# Patient Record
Sex: Male | Born: 1965
Health system: Southern US, Community
[De-identification: ages and names within clinical notes are randomized; demographics above are authoritative.]

## PROBLEM LIST (undated history)

## (undated) ENCOUNTER — Ambulatory Visit: Admission: EM | Discharge status: Absent without leave | Payer: Federal, State, Local not specified - PPO

## (undated) ENCOUNTER — Ambulatory Visit

## (undated) DIAGNOSIS — I10 Essential (primary) hypertension: Secondary | ICD-10-CM

## (undated) HISTORY — PX: HERNIA REPAIR: SHX51

## (undated) HISTORY — PX: NASAL SINUS SURGERY: SHX719

---

## 1998-07-08 ENCOUNTER — Emergency Department (HOSPITAL_COMMUNITY): Admission: EM | Admit: 1998-07-08 | Discharge: 1998-07-08 | Payer: Self-pay | Admitting: Emergency Medicine

## 2000-06-25 ENCOUNTER — Ambulatory Visit: Admission: RE | Admit: 2000-06-25 | Discharge: 2000-06-25 | Payer: Self-pay | Admitting: General Surgery

## 2002-05-12 ENCOUNTER — Emergency Department (HOSPITAL_COMMUNITY): Admission: EM | Admit: 2002-05-12 | Discharge: 2002-05-13 | Payer: Self-pay | Admitting: Emergency Medicine

## 2006-05-06 ENCOUNTER — Emergency Department (HOSPITAL_COMMUNITY): Admission: EM | Admit: 2006-05-06 | Discharge: 2006-05-06 | Payer: Self-pay | Admitting: Emergency Medicine

## 2010-05-06 ENCOUNTER — Emergency Department (HOSPITAL_COMMUNITY): Admission: EM | Admit: 2010-05-06 | Discharge: 2010-05-06 | Payer: Self-pay | Admitting: Emergency Medicine

## 2011-03-16 NOTE — Op Note (Signed)
[place]  Patient:    Sean Clements, Sean Clements                    MRN: [id number] Proc. Date: 06/24/00 Adm. Date:  06/24/00 Attending:  Sharlet Salina T. Hoxworth, M.D.                           Operative Report  PREOPERATIVE DIAGNOSIS:  Umbilical hernia.  PREOPERATIVE DIAGNOSIS:  Umbilical hernia.  OPERATION:   Repair of umbilical hernia with mesh.  SURGEON:  Lorne Skeens. Hoxworth, M.D.  ANESTHESIA:  General.  INDICATIONS:  Sean Clements is a 45 year old white male who presents with a history of a uncomfortable and enlarging bulging umbilicus and exam confirms a reduced umbilical hernia.  Repair under general anesthesia has been recommended and accepted.  The nature of the procedures, indications, use of mesh and risks of bleeding, infection and recurrence were discussed and bleeding, infection and recurrence were discussed and understood preoperatively.  He is now brought to the operating room for this procedure.  DESCRIPTION OF PROCEDURE:  The patient was brought to the operating room and placed in the supine position on the operating room table, and general endotracheal anesthesia was induced.  The abdomen sterilely prepped and draped.  Broad spectrum antibiotics were given preoperatively.  A 1 curvilinear incision was made just below the umbilicus and dissection carried down to the subcutaneous tissue.  The umbilical skin was then sharply dissected up off of the hernia defect.  The defect contained preperitoneal fat with about 1 cm in diameter.  Subcutaneous was cleared off the fascia back 2-3 cm in all directions.  The preperitoneal hernia contents were reduced and the defect was then closed with interrupted inverting 0 Prolene sutures transversely.  A piece of Prolene mesh was then trimmed to size approximately 5 x 5 cm and was placed as an onlay and sutured in place with interrupted 0 Prolene.  The wound was irrigated and inspected for  hemostasis which was complete.  The soft tissue was infiltrated with Marcaine.  The subcutaneous was closed with interrupted 5-0 Monocryl and the skin with running subcuticular 5-0 Monocryl and Steri-Strips.  Sponge, needle and instrument counts were correct. Dry sterile was applied.  The patient taken to recovery in good condition. DD:  06/25/00 TD:  06/25/00 Job: [id number]

## 2011-11-19 ENCOUNTER — Ambulatory Visit: Payer: BC Managed Care – PPO | Attending: Internal Medicine | Admitting: Physical Therapy

## 2012-10-28 ENCOUNTER — Emergency Department (HOSPITAL_COMMUNITY): Payer: BC Managed Care – PPO

## 2012-10-28 ENCOUNTER — Encounter (HOSPITAL_COMMUNITY): Payer: Self-pay | Admitting: Emergency Medicine

## 2012-10-28 ENCOUNTER — Emergency Department (HOSPITAL_COMMUNITY)
Admission: EM | Admit: 2012-10-28 | Discharge: 2012-10-28 | Disposition: A | Discharge status: Home / Self Care | Payer: BC Managed Care – PPO | Attending: Emergency Medicine | Admitting: Emergency Medicine

## 2012-10-28 DIAGNOSIS — Y92410 Unspecified street and highway as the place of occurrence of the external cause: Secondary | ICD-10-CM | POA: Insufficient documentation

## 2012-10-28 DIAGNOSIS — Y9389 Activity, other specified: Secondary | ICD-10-CM | POA: Insufficient documentation

## 2012-10-28 DIAGNOSIS — S0993XA Unspecified injury of face, initial encounter: Secondary | ICD-10-CM | POA: Insufficient documentation

## 2012-10-28 DIAGNOSIS — Z79899 Other long term (current) drug therapy: Secondary | ICD-10-CM | POA: Insufficient documentation

## 2012-10-28 MED ORDER — NAPROXEN 500 MG PO TABS: 500.0000 mg | ORAL_TABLET | Freq: Two times a day (BID) | ORAL | Status: DC

## 2012-10-28 MED ORDER — DIAZEPAM 5 MG PO TABS: 5.0000 mg | ORAL_TABLET | Freq: Two times a day (BID) | ORAL | Status: DC
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
## 2012-10-29 NOTE — ED Provider Notes (Signed)
Medical screening examination/treatment/procedure(s) were performed by non-physician practitioner and as supervising physician I was immediately available for consultation/collaboration.  Zelena Bushong T Sorayah Schrodt, MD 10/29/12 1516 
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
## 2015-02-02 ENCOUNTER — Emergency Department (HOSPITAL_COMMUNITY)
Admission: EM | Admit: 2015-02-02 | Discharge: 2015-02-02 | Disposition: A | Discharge status: Home / Self Care | Attending: Emergency Medicine | Admitting: Emergency Medicine

## 2015-02-02 ENCOUNTER — Encounter (HOSPITAL_COMMUNITY): Payer: Self-pay | Admitting: *Deleted

## 2015-02-02 DIAGNOSIS — Y990 Civilian activity done for income or pay: Secondary | ICD-10-CM | POA: Diagnosis not present

## 2015-02-02 DIAGNOSIS — S29019A Strain of muscle and tendon of unspecified wall of thorax, initial encounter: Secondary | ICD-10-CM

## 2015-02-02 DIAGNOSIS — Y9289 Other specified places as the place of occurrence of the external cause: Secondary | ICD-10-CM | POA: Insufficient documentation

## 2015-02-02 DIAGNOSIS — X58XXXA Exposure to other specified factors, initial encounter: Secondary | ICD-10-CM | POA: Insufficient documentation

## 2015-02-02 DIAGNOSIS — Y9389 Activity, other specified: Secondary | ICD-10-CM | POA: Insufficient documentation

## 2015-02-02 DIAGNOSIS — S3992XA Unspecified injury of lower back, initial encounter: Secondary | ICD-10-CM | POA: Diagnosis present

## 2015-02-02 DIAGNOSIS — S29012A Strain of muscle and tendon of back wall of thorax, initial encounter: Secondary | ICD-10-CM | POA: Diagnosis not present

## 2015-02-02 MED ORDER — LIDOCAINE 5 % EX PTCH
1.0000 | MEDICATED_PATCH | CUTANEOUS | Status: DC
  Administered 2015-02-02: 1 via TRANSDERMAL
  Filled 2015-02-02: qty 1

## 2015-02-02 MED ORDER — TRAMADOL HCL 50 MG PO TABS: 50.0000 mg | ORAL_TABLET | Freq: Four times a day (QID) | ORAL | Status: DC | PRN

## 2015-02-02 MED ORDER — TRAMADOL HCL 50 MG PO TABS
50.0000 mg | ORAL_TABLET | Freq: Once | ORAL | Status: AC
  Administered 2015-02-02: 50 mg via ORAL
  Filled 2015-02-02: qty 1

## 2015-02-02 MED ORDER — CYCLOBENZAPRINE HCL 10 MG PO TABS
5.0000 mg | ORAL_TABLET | Freq: Once | ORAL | Status: AC
  Administered 2015-02-02: 5 mg via ORAL
  Filled 2015-02-02: qty 1

## 2015-02-02 MED ORDER — CYCLOBENZAPRINE HCL 5 MG PO TABS: 5.0000 mg | ORAL_TABLET | Freq: Three times a day (TID) | ORAL | Status: DC

## 2015-02-02 NOTE — ED Notes (Signed)
Pt states that he was at work and was lifting and felt pain to his left mid back area; pt states that the pain is worse with movement; pt denies numbness or tingling

## 2015-02-02 NOTE — Discharge Instructions (Signed)
Cryotherapy Cryotherapy is when you put ice on your injury. Ice helps lessen pain and puffiness (swelling) after an injury. Ice works the best when you start using it in the first 24 to 48 hours after an injury. HOME CARE  Put a dry or damp towel between the ice pack and your skin.  You may press gently on the ice pack.  Leave the ice on for no more than 10 to 20 minutes at a time.  Check your skin after 5 minutes to make sure your skin is okay.  Rest at least 20 minutes between ice pack uses.  Stop using ice when your skin loses feeling (numbness).  Do not use ice on someone who cannot tell you when it hurts. This includes small children and people with memory problems (dementia). GET HELP RIGHT AWAY IF:  You have white spots on your skin.  Your skin turns blue or pale.  Your skin feels waxy or hard.  Your puffiness gets worse. MAKE SURE YOU:   Understand these instructions.  Will watch your condition.  Will get help right away if you are not doing well or get worse. Document Released: 04/02/2008 Document Revised: 01/07/2012 Document Reviewed: 06/07/2011 ExitCare Patient Information 2015 [employer]. This information is not intended to replace advice given to you by your health care provider. Make sure you discuss any questions you have with your health care provider.  

## 2015-02-02 NOTE — ED Provider Notes (Signed)
CSN: [id number]     Arrival date & time 02/02/15  2042 History   None    Chief Complaint  Patient presents with  . Back Pain   Patient is a 49 y.o. male presenting with back pain. The history is provided by the patient. No language interpreter was used.  Back Pain Pain location: left side. Quality:  Aching Radiates to:  Does not radiate Pain severity:  Moderate Pain is:  Same all the time Onset quality:  Sudden Duration:  5 hours Timing:  Constant Progression:  Unchanged Chronicity:  New Context: lifting heavy objects   Relieved by:  Nothing Worsened by:  Movement Ineffective treatments:  Ibuprofen Associated symptoms: no fever and no headaches   Risk factors: obesity    This chart was scribed for nurse practitioner Earley Favor, NP working with Raeford Razor, MD, by Andrew Au, ED Scribe. This patient was seen in room WTR7/WTR7 and the patient's care was started at 10:18 PM.  Sean Clements is a 49 y.o. male who presents to the Emergency Department complaining of back pain that began 4.5 hours ago. 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 Pt reports taking ibuprofen without relief.   History reviewed. No pertinent past medical history. Past Surgical History  Procedure Laterality Date  . Hernia repair    . Nasal sinus surgery     No family history on file. History  Substance Use Topics  . Smoking status: Never Smoker   . Smokeless tobacco: Not on file  . Alcohol Use: Yes     Comment: rarely    Review of Systems  Constitutional: Negative for fever.  Respiratory: Negative for shortness of breath.   Musculoskeletal: Positive for back pain and arthralgias.  Skin: Negative for rash and wound.  Neurological: Negative for dizziness and headaches.  All other systems reviewed and are negative.  Allergies  Review of patient's allergies indicates no known allergies.  Home Medications   Prior to Admission  medications   Medication Sig Start Date End Date Taking? Authorizing Provider  ibuprofen (ADVIL,MOTRIN) 200 MG tablet Take 400-600 mg by mouth 2 (two) times daily as needed for moderate pain (pain). As needed for pain   Yes Historical Provider, MD  lisinopril-hydrochlorothiazide (PRINZIDE,ZESTORETIC) 10-12.5 MG per tablet Take 1 tablet by mouth daily.   Yes Historical Provider, MD  cyclobenzaprine (FLEXERIL) 5 MG tablet Take 1 tablet (5 mg total) by mouth 3 (three) times daily. 02/02/15   Earley Favor, NP  diazepam (VALIUM) 5 MG tablet Take 1 tablet (5 mg total) by mouth 2 (two) times daily. Patient not taking: Reported on 02/02/2015 10/28/12   Santiago Glad, PA-C  naproxen (NAPROSYN) 500 MG tablet Take 1 tablet (500 mg total) by mouth 2 (two) times daily. Patient not taking: Reported on 02/02/2015 10/28/12   Santiago Glad, PA-C  traMADol (ULTRAM) 50 MG tablet Take 1 tablet (50 mg total) by mouth every 6 (six) hours as needed for severe pain. 02/02/15   Earley Favor, NP   BP 139/82 mmHg  Pulse 76  Temp(Src) 98 F (36.7 C) (Oral)  Resp 18  SpO2 100% Physical Exam  Constitutional: He is oriented to person, place, and time. He appears well-developed and well-nourished. No distress.  HENT:  Head: Normocephalic and atraumatic.  Eyes: Conjunctivae and EOM are normal.  Neck: Normal range of motion. Neck supple.  Cardiovascular: Normal rate.   Pulmonary/Chest: Effort normal.  Musculoskeletal: Normal range of motion.       Lumbar back: He exhibits tenderness. He exhibits normal range of motion, no swelling, no pain and no spasm.       Back:  Neurological: He is alert and oriented to person, place, and time.  Skin: Skin is warm and dry.  Psychiatric: He has a normal mood and affect. His behavior is normal.  Nursing note and vitals reviewed.   ED Course  Procedures (including critical care time) DIAGNOSTIC STUDIES: Oxygen Saturation is 100% on RA, normal  by my interpretation.    COORDINATION  OF CARE: 10:18 PM- Pt advised of plan for treatment which includes a muscle relaxer and pt agrees.  Labs Review Labs Reviewed - No data to display  Imaging Review No results found.   EKG Interpretation None     Will Rx Ibuprofen, Flexerile, Ultram for severe pain and apply Lidocaine patch in ED MDM   Final diagnoses:  Strain of muscle of torso, initial encounter    I personally performed the services described in this documentation, which was scribed in my presence. The recorded information has been reviewed and is accurate.   Earley FavorGail Gaelle Adriance, NP 02/02/15 [id number]  Raeford RazorStephen Kohut, MD 02/02/15 2322

## 2016-02-14 DIAGNOSIS — M5416 Radiculopathy, lumbar region: Secondary | ICD-10-CM | POA: Diagnosis not present

## 2016-02-14 DIAGNOSIS — M545 Low back pain: Secondary | ICD-10-CM | POA: Diagnosis not present

## 2016-02-20 DIAGNOSIS — M545 Low back pain: Secondary | ICD-10-CM | POA: Diagnosis not present

## 2016-02-23 DIAGNOSIS — M545 Low back pain: Secondary | ICD-10-CM | POA: Diagnosis not present

## 2016-04-16 DIAGNOSIS — M545 Low back pain: Secondary | ICD-10-CM | POA: Diagnosis not present

## 2016-04-27 DIAGNOSIS — H40013 Open angle with borderline findings, low risk, bilateral: Secondary | ICD-10-CM | POA: Diagnosis not present

## 2016-06-01 DIAGNOSIS — I10 Essential (primary) hypertension: Secondary | ICD-10-CM | POA: Diagnosis not present

## 2016-06-28 DIAGNOSIS — F331 Major depressive disorder, recurrent, moderate: Secondary | ICD-10-CM | POA: Diagnosis not present

## 2016-07-19 DIAGNOSIS — I10 Essential (primary) hypertension: Secondary | ICD-10-CM | POA: Diagnosis not present

## 2016-11-23 DIAGNOSIS — N400 Enlarged prostate without lower urinary tract symptoms: Secondary | ICD-10-CM | POA: Diagnosis not present

## 2016-11-30 DIAGNOSIS — N400 Enlarged prostate without lower urinary tract symptoms: Secondary | ICD-10-CM | POA: Diagnosis not present

## 2017-05-20 DIAGNOSIS — M545 Low back pain: Secondary | ICD-10-CM | POA: Diagnosis not present

## 2017-07-02 DIAGNOSIS — H40013 Open angle with borderline findings, low risk, bilateral: Secondary | ICD-10-CM | POA: Diagnosis not present

## 2017-07-04 DIAGNOSIS — I10 Essential (primary) hypertension: Secondary | ICD-10-CM | POA: Diagnosis not present

## 2017-07-04 DIAGNOSIS — R600 Localized edema: Secondary | ICD-10-CM | POA: Diagnosis not present

## 2018-01-13 DIAGNOSIS — H40013 Open angle with borderline findings, low risk, bilateral: Secondary | ICD-10-CM | POA: Diagnosis not present

## 2018-06-11 ENCOUNTER — Encounter (HOSPITAL_BASED_OUTPATIENT_CLINIC_OR_DEPARTMENT_OTHER): Payer: Self-pay | Admitting: Emergency Medicine

## 2018-06-11 ENCOUNTER — Other Ambulatory Visit: Payer: Self-pay

## 2018-06-11 ENCOUNTER — Emergency Department (HOSPITAL_BASED_OUTPATIENT_CLINIC_OR_DEPARTMENT_OTHER)
Admission: EM | Admit: 2018-06-11 | Discharge: 2018-06-12 | Disposition: A | Discharge status: Home / Self Care | Attending: Emergency Medicine | Admitting: Emergency Medicine

## 2018-06-11 DIAGNOSIS — Y990 Civilian activity done for income or pay: Secondary | ICD-10-CM | POA: Insufficient documentation

## 2018-06-11 DIAGNOSIS — Y929 Unspecified place or not applicable: Secondary | ICD-10-CM | POA: Insufficient documentation

## 2018-06-11 DIAGNOSIS — X500XXA Overexertion from strenuous movement or load, initial encounter: Secondary | ICD-10-CM | POA: Insufficient documentation

## 2018-06-11 DIAGNOSIS — Z79899 Other long term (current) drug therapy: Secondary | ICD-10-CM | POA: Insufficient documentation

## 2018-06-11 DIAGNOSIS — Y9389 Activity, other specified: Secondary | ICD-10-CM | POA: Insufficient documentation

## 2018-06-11 DIAGNOSIS — S46911A Strain of unspecified muscle, fascia and tendon at shoulder and upper arm level, right arm, initial encounter: Secondary | ICD-10-CM | POA: Diagnosis not present

## 2018-06-11 DIAGNOSIS — S40911A Unspecified superficial injury of right shoulder, initial encounter: Secondary | ICD-10-CM | POA: Diagnosis present

## 2018-06-11 NOTE — ED Triage Notes (Signed)
Pt states he was at work moving heavy packages and felt a pain in his neck  Pt states later the pain moved to his right shoulder area

## 2018-06-12 MED ORDER — NAPROXEN 500 MG PO TABS: 500.0000 mg | ORAL_TABLET | Freq: Two times a day (BID) | ORAL | 0 refills | Status: DC

## 2018-06-12 MED ORDER — CYCLOBENZAPRINE HCL 10 MG PO TABS: 10.0000 mg | ORAL_TABLET | Freq: Three times a day (TID) | ORAL | 0 refills | Status: DC | PRN

## 2018-06-12 NOTE — ED Provider Notes (Signed)
[facility] EMERGENCY DEPARTMENT Provider Note   CSN: [id number] Arrival date & time: 06/11/18  2300     History   Chief Complaint Chief Complaint  Patient presents with  . Neck Pain    HPI Sean AlarGordon L Clements is a 52 y.o. male.  Patient is a 52 year old male with no significant past medical history.  He presents for evaluation of pain in the right shoulder and neck.  This began while he was moving boxes at work.  He denies any numbness or tingling.  He denies any weakness of the arm.  The history is provided by the patient.  Neck Pain   This is a new problem. The current episode started 1 to 2 hours ago. The problem occurs constantly. The problem has not changed since onset.The pain is associated with lifting a heavy object. There has been no fever. The pain is present in the generalized neck. The pain radiates to the right shoulder. The pain is moderate. The pain is the same all the time.    History reviewed. No pertinent past medical history.  There are no active problems to display for this patient.   Past Surgical History:  Procedure Laterality Date  . HERNIA REPAIR    . NASAL SINUS SURGERY          Home Medications    Prior to Admission medications   Medication Sig Start Date End Date Taking? Authorizing Provider  ibuprofen (ADVIL,MOTRIN) 200 MG tablet Take 400-600 mg by mouth 2 (two) times daily as needed for moderate pain (pain). As needed for pain   Yes [provider]  lisinopril-hydrochlorothiazide (PRINZIDE,ZESTORETIC) 10-12.5 MG per tablet Take 1 tablet by mouth daily.   Yes [provider]  cyclobenzaprine (FLEXERIL) 5 MG tablet Take 1 tablet (5 mg total) by mouth 3 (three) times daily. 02/02/15   Earley FavorSchulz, Gail, NP  diazepam (VALIUM) 5 MG tablet Take 1 tablet (5 mg total) by mouth 2 (two) times daily. Patient not taking: Reported on 02/02/2015 10/28/12   Santiago GladLaisure, Heather, PA-C  naproxen (NAPROSYN) 500 MG tablet Take 1 tablet (500  mg total) by mouth 2 (two) times daily. Patient not taking: Reported on 02/02/2015 10/28/12   Santiago GladLaisure, Heather, PA-C  traMADol (ULTRAM) 50 MG tablet Take 1 tablet (50 mg total) by mouth every 6 (six) hours as needed for severe pain. 02/02/15   Earley FavorSchulz, Gail, NP    Family History Family History  Problem Relation Age of Onset  . Stroke Father   . Hypertension Other     Social History Social History   Tobacco Use  . Smoking status: Never Smoker  . Smokeless tobacco: Never Used  Substance Use Topics  . Alcohol use: Yes    Comment: rarely  . Drug use: No     Allergies   Patient has no known allergies.   Review of Systems Review of Systems  Musculoskeletal: Positive for neck pain.  All other systems reviewed and are negative.    Physical Exam Updated Vital Signs BP (!) 140/92 (BP Location: Left Arm)   Pulse 67   Temp 98.4 F (36.9 C) (Oral)   Resp 16   Ht 5\' 11"  (1.803 m)   Wt 104.3 kg   SpO2 99%   BMI 32.08 kg/m   Physical Exam  Constitutional: He is oriented to person, place, and time. He appears well-developed and well-nourished. No distress.  HENT:  Head: Normocephalic and atraumatic.  Mouth/Throat: Oropharynx is clear and moist.  Neck: Normal  range of motion. Neck supple.  Cardiovascular: Normal rate and regular rhythm. Exam reveals no friction rub.  No murmur heard. Pulmonary/Chest: Effort normal and breath sounds normal. No respiratory distress. He has no wheezes. He has no rales.  Abdominal: Soft. Bowel sounds are normal. He exhibits no distension. There is no tenderness.  Musculoskeletal: Normal range of motion. He exhibits no edema.  There is tenderness to palpation in the soft tissues of the right lateral neck and right shoulder.  He has good range of motion of the neck and shoulder, but with some discomfort.Marland Kitchen.  Ulnar and radial pulses are easily palpable and motor and sensation is intact throughout the entire hand.  Neurological: He is alert and oriented  to person, place, and time. Coordination normal.  Skin: Skin is warm and dry. He is not diaphoretic.  Nursing note and vitals reviewed.    ED Treatments / Results  Labs (all labs ordered are listed, but only abnormal results are displayed) Labs Reviewed - No data to display  EKG None  Radiology No results found.  Procedures Procedures (including critical care time)  Medications Ordered in ED Medications - No data to display   Initial Impression / Assessment and Plan / ED Course  I have reviewed the triage vital signs and the nursing notes.  Pertinent labs & imaging results that were available during my care of the patient were reviewed by me and considered in my medical decision making (see chart for details).  This appears to be a strain of this shoulder and neck.  This will be treated with naproxen and Flexeril.  He will be off work for 2 days.  He is to follow-up with primary doctor if not improving in the next week.  Final Clinical Impressions(s) / ED Diagnoses   Final diagnoses:  None    ED Discharge Orders    None       Geoffery Lyonselo, Renarda Mullinix, MD 06/12/18 [id number]

## 2018-06-12 NOTE — Discharge Instructions (Addendum)
Naproxen as prescribed.  Flexeril as prescribed as needed for pain not relieved with naproxen.  Follow-up with your primary doctor if symptoms are not improving in the next week.

## 2018-11-06 DIAGNOSIS — R195 Other fecal abnormalities: Secondary | ICD-10-CM | POA: Diagnosis not present

## 2018-11-06 DIAGNOSIS — K30 Functional dyspepsia: Secondary | ICD-10-CM | POA: Diagnosis not present

## 2018-11-18 DIAGNOSIS — I10 Essential (primary) hypertension: Secondary | ICD-10-CM | POA: Diagnosis not present

## 2018-12-11 ENCOUNTER — Encounter: Payer: Self-pay | Admitting: Emergency Medicine

## 2018-12-11 ENCOUNTER — Ambulatory Visit
Admission: EM | Admit: 2018-12-11 | Discharge: 2018-12-11 | Disposition: A | Discharge status: Home / Self Care | Payer: Federal, State, Local not specified - PPO | Attending: Family Medicine | Admitting: Family Medicine

## 2018-12-11 DIAGNOSIS — J069 Acute upper respiratory infection, unspecified: Secondary | ICD-10-CM

## 2018-12-11 DIAGNOSIS — B9789 Other viral agents as the cause of diseases classified elsewhere: Secondary | ICD-10-CM | POA: Diagnosis not present

## 2018-12-11 HISTORY — DX: Essential (primary) hypertension: I10

## 2018-12-11 MED ORDER — AZITHROMYCIN 250 MG PO TABS: 250.0000 mg | ORAL_TABLET | Freq: Every day | ORAL | 0 refills | Status: DC

## 2018-12-11 MED ORDER — BENZONATATE 200 MG PO CAPS: 200.0000 mg | ORAL_CAPSULE | Freq: Three times a day (TID) | ORAL | 0 refills | Status: AC | PRN

## 2018-12-11 MED ORDER — CETIRIZINE-PSEUDOEPHEDRINE ER 5-120 MG PO TB12: 1.0000 | ORAL_TABLET | Freq: Two times a day (BID) | ORAL | 0 refills | Status: DC

## 2018-12-11 NOTE — ED Triage Notes (Signed)
Pt presents to [facility] after having cough, congestion, body aches and headache since Friday.  States cough and congestion still remain.  Denies known fever.

## 2018-12-11 NOTE — Discharge Instructions (Signed)
You are most likely nearing the end of a viral upper respiratory infection I recommend to continue symptomatic management of your symptoms over the next 3 to 4 days Please continue Mucinex, may add in Zyrtec-D as alternative May use Tessalon every 8 hours as needed for cough Continue to rest, drink plenty of fluids over the weekend May fill prescription for azithromycin on Monday if you are not having any improvement of her symptoms.  Please continue to monitor your breathing and temperature, please follow-up if symptoms worsening, developing shortness of breath, difficulty breathing, fevers, persistent symptoms

## 2018-12-11 NOTE — ED Provider Notes (Signed)
[facility]    CSN: [id number] Arrival date & time: 12/11/18  1008     History   Chief Complaint Chief Complaint  Patient presents with  . Cough    HPI Marikay AlarGordon L Mcpeek is a 53 y.o. male history of hypertension presenting today for evaluation of URI symptoms.  Patient states that he has had a cough and congestion.  He is also had noted body aches and headaches.  He feels congested in his chest, but states that the cough is been dry.  Denies any fevers.  Has had some mild diarrhea but denies nausea and vomiting.  He has tried Mucinex, Alka-Seltzer as well as ibuprofen for symptoms.  He believes that his cough is slightly improved and his body aches are slightly better, but still having cough and congestion which is why he came today.  Initial sore throat, but this is resolved.  HPI  Past Medical History:  Diagnosis Date  . Hypertension     There are no active problems to display for this patient.   Past Surgical History:  Procedure Laterality Date  . HERNIA REPAIR    . NASAL SINUS SURGERY         Home Medications    Prior to Admission medications   Medication Sig Start Date End Date Taking? Authorizing Provider  lisinopril-hydrochlorothiazide (PRINZIDE,ZESTORETIC) 10-12.5 MG per tablet Take 1 tablet by mouth daily.   Yes [provider]  azithromycin (ZITHROMAX) 250 MG tablet Take 1 tablet (250 mg total) by mouth daily. Take first 2 tablets together, then 1 every day until finished. 12/15/18   Jouri Threat C, PA-C  benzonatate (TESSALON) 200 MG capsule Take 1 capsule (200 mg total) by mouth 3 (three) times daily as needed for up to 7 days for cough. 12/11/18 12/18/18  Ridhima Golberg C, PA-C  cetirizine-pseudoephedrine (ZYRTEC-D) 5-120 MG tablet Take 1 tablet by mouth 2 (two) times daily. 12/11/18   Kathlean Cinco C, PA-C  cyclobenzaprine (FLEXERIL) 10 MG tablet Take 1 tablet (10 mg total) by mouth 3 (three) times daily as needed for muscle  spasms. 06/12/18   Geoffery Lyonselo, [place], MD  ibuprofen (ADVIL,MOTRIN) 200 MG tablet Take 400-600 mg by mouth 2 (two) times daily as needed for moderate pain (pain). As needed for pain    [provider]  naproxen (NAPROSYN) 500 MG tablet Take 1 tablet (500 mg total) by mouth 2 (two) times daily. 06/12/18   Geoffery Lyonselo, [place], MD  traMADol (ULTRAM) 50 MG tablet Take 1 tablet (50 mg total) by mouth every 6 (six) hours as needed for severe pain. 02/02/15   Earley FavorSchulz, Gail, NP    Family History Family History  Problem Relation Age of Onset  . Stroke Father   . Hypertension Other     Social History Social History   Tobacco Use  . Smoking status: Never Smoker  . Smokeless tobacco: Never Used  Substance Use Topics  . Alcohol use: Yes    Comment: rarely  . Drug use: No     Allergies   Patient has no known allergies.   Review of Systems Review of Systems  Constitutional: Negative for activity change, appetite change, chills, fatigue and fever.  HENT: Positive for congestion and rhinorrhea. Negative for ear pain, sinus pressure, sore throat and trouble swallowing.   Eyes: Negative for discharge and redness.  Respiratory: Positive for cough. Negative for chest tightness and shortness of breath.   Cardiovascular: Negative for chest pain.  Gastrointestinal: Positive for diarrhea. Negative for  abdominal pain, nausea and vomiting.  Musculoskeletal: Positive for myalgias.  Skin: Negative for rash.  Neurological: Positive for headaches. Negative for dizziness and light-headedness.     Physical Exam Triage Vital Signs ED Triage Vitals [12/11/18 1021]  Enc Vitals Group     BP 133/88     Pulse Rate 68     Resp 18     Temp 97.7 F (36.5 C)     Temp Source Oral     SpO2 95 %     Weight      Height      Head Circumference      Peak Flow      Pain Score 3     Pain Loc      Pain Edu?      Excl. in [place]?    No data found.  Updated Vital Signs BP 133/88 (BP Location: Left Arm)   Pulse 68    Temp 97.7 F (36.5 C) (Oral)   Resp 18   SpO2 95%   Visual Acuity Right Eye Distance:   Left Eye Distance:   Bilateral Distance:    Right Eye Near:   Left Eye Near:    Bilateral Near:     Physical Exam Vitals signs and nursing note reviewed.  Constitutional:      Appearance: He is well-developed.  HENT:     Head: Normocephalic and atraumatic.     Ears:     Comments: Bilateral ears without tenderness to palpation of external auricle, tragus and mastoid, EAC's without erythema or swelling, TM's with good bony landmarks and cone of light. Non erythematous.    Mouth/Throat:     Comments: Oral mucosa pink and moist, no tonsillar enlargement or exudate. Posterior pharynx patent and nonerythematous, no uvula deviation or swelling. Normal phonation. Eyes:     Conjunctiva/sclera: Conjunctivae normal.  Neck:     Musculoskeletal: Neck supple.  Cardiovascular:     Rate and Rhythm: Normal rate and regular rhythm.     Heart sounds: No murmur.  Pulmonary:     Effort: Pulmonary effort is normal. No respiratory distress.     Breath sounds: Normal breath sounds.     Comments: Breathing comfortably at rest, CTABL, no wheezing, rales or other adventitious sounds auscultated Abdominal:     Palpations: Abdomen is soft.     Tenderness: There is no abdominal tenderness.  Skin:    General: Skin is warm and dry.  Neurological:     Mental Status: He is alert.      UC Treatments / Results  Labs (all labs ordered are listed, but only abnormal results are displayed) Labs Reviewed - No data to display  EKG None  Radiology No results found.  Procedures Procedures (including critical care time)  Medications Ordered in UC Medications - No data to display  Initial Impression / Assessment and Plan / UC Course  I have reviewed the triage vital signs and the nursing notes.  Pertinent labs & imaging results that were available during my care of the patient were reviewed by me and  considered in my medical decision making (see chart for details).     Vital signs stable, exam nonfocal, URI symptoms x6 days, most likely still viral etiology.  Will recommend continued symptomatic and supportive care.  Advised adding Zyrtec-D to Mucinex, blood pressure control today.  Tessalon as needed for cough.  Continue to push plenty of fluids.  May fill prescription for azithromycin on Monday if not having continued  improvement of his symptoms.Discussed strict return precautions. Patient verbalized understanding and is agreeable with plan.  Final Clinical Impressions(s) / UC Diagnoses   Final diagnoses:  Viral URI with cough     Discharge Instructions     You are most likely nearing the end of a viral upper respiratory infection I recommend to continue symptomatic management of your symptoms over the next 3 to 4 days Please continue Mucinex, may add in Zyrtec-D as alternative May use Tessalon every 8 hours as needed for cough Continue to rest, drink plenty of fluids over the weekend May fill prescription for azithromycin on Monday if you are not having any improvement of her symptoms.  Please continue to monitor your breathing and temperature, please follow-up if symptoms worsening, developing shortness of breath, difficulty breathing, fevers, persistent symptoms   ED Prescriptions    Medication Sig Dispense Auth. Provider   cetirizine-pseudoephedrine (ZYRTEC-D) 5-120 MG tablet Take 1 tablet by mouth 2 (two) times daily. 20 tablet Jalysa Swopes C, PA-C   benzonatate (TESSALON) 200 MG capsule Take 1 capsule (200 mg total) by mouth 3 (three) times daily as needed for up to 7 days for cough. 28 capsule Hance Caspers C, PA-C   azithromycin (ZITHROMAX) 250 MG tablet Take 1 tablet (250 mg total) by mouth daily. Take first 2 tablets together, then 1 every day until finished. 6 tablet Chelsae Zanella, [place] C, PA-C     Controlled Substance Prescriptions [place] Controlled Substance Registry  consulted? Not Applicable   Lew Dawes, [place] 12/11/18 1038

## 2018-12-11 NOTE — ED Notes (Signed)
Patient able to ambulate independently  

## 2019-01-12 DIAGNOSIS — H40013 Open angle with borderline findings, low risk, bilateral: Secondary | ICD-10-CM | POA: Diagnosis not present

## 2019-03-27 DIAGNOSIS — M79674 Pain in right toe(s): Secondary | ICD-10-CM | POA: Diagnosis not present

## 2019-03-27 DIAGNOSIS — B351 Tinea unguium: Secondary | ICD-10-CM | POA: Diagnosis not present

## 2019-03-27 DIAGNOSIS — L600 Ingrowing nail: Secondary | ICD-10-CM | POA: Diagnosis not present

## 2019-03-27 DIAGNOSIS — M79675 Pain in left toe(s): Secondary | ICD-10-CM | POA: Diagnosis not present

## 2019-05-04 DIAGNOSIS — L600 Ingrowing nail: Secondary | ICD-10-CM | POA: Diagnosis not present

## 2019-05-04 DIAGNOSIS — M79675 Pain in left toe(s): Secondary | ICD-10-CM | POA: Diagnosis not present

## 2019-05-04 DIAGNOSIS — M79674 Pain in right toe(s): Secondary | ICD-10-CM | POA: Diagnosis not present

## 2019-06-15 ENCOUNTER — Encounter: Payer: Self-pay | Admitting: Emergency Medicine

## 2019-06-15 ENCOUNTER — Ambulatory Visit
Admission: EM | Admit: 2019-06-15 | Discharge: 2019-06-15 | Disposition: A | Discharge status: Home / Self Care | Payer: Federal, State, Local not specified - PPO | Attending: Physician Assistant | Admitting: Physician Assistant

## 2019-06-15 ENCOUNTER — Other Ambulatory Visit: Payer: Self-pay

## 2019-06-15 DIAGNOSIS — I10 Essential (primary) hypertension: Secondary | ICD-10-CM

## 2019-06-15 DIAGNOSIS — M7521 Bicipital tendinitis, right shoulder: Secondary | ICD-10-CM | POA: Diagnosis not present

## 2019-06-15 MED ORDER — DICLOFENAC SODIUM 75 MG PO TBEC: 75.0000 mg | DELAYED_RELEASE_TABLET | Freq: Two times a day (BID) | ORAL | 0 refills | Status: DC

## 2019-06-15 NOTE — ED Provider Notes (Signed)
[facility]    CSN: [id number] Arrival date & time: 06/15/19  1149     History   Chief Complaint Chief Complaint  Patient presents with   Shoulder Pain    HPI Sean Clements is a 53 y.o. male.   53 year old male with history of hypertension comes in for 1 month history of right shoulder pain.  Denies injury/trauma.  States given pain is not resolved, came in for evaluation.  Denies worsening of pain.  Pain is to the anterior shoulder, that is present during movement and lifting.  Denies pain at rest.  Pain at times can radiate down the arm.  Denies swelling of the joint, erythema, warmth.  Denies numbness, tingling.  Has been taking ibuprofen 400 to 600 mg daily/twice daily without much relief.  Has also been doing heat compress without relief.  Work requires heavy lifting, and repetitive motion.     Past Medical History:  Diagnosis Date   Hypertension     There are no active problems to display for this patient.   Past Surgical History:  Procedure Laterality Date   HERNIA REPAIR     NASAL SINUS SURGERY         Home Medications    Prior to Admission medications   Medication Sig Start Date End Date Taking? Authorizing Provider  azithromycin (ZITHROMAX) 250 MG tablet Take 1 tablet (250 mg total) by mouth daily. Take first 2 tablets together, then 1 every day until finished. Patient not taking: Reported on 06/15/2019 12/15/18   Wieters, Hallie C, PA-C  cetirizine-pseudoephedrine (ZYRTEC-D) 5-120 MG tablet Take 1 tablet by mouth 2 (two) times daily. 12/11/18   Wieters, Hallie C, PA-C  cyclobenzaprine (FLEXERIL) 10 MG tablet Take 1 tablet (10 mg total) by mouth 3 (three) times daily as needed for muscle spasms. Patient not taking: Reported on 06/15/2019 06/12/18   Geoffery Lyonselo, [place], MD  diclofenac (VOLTAREN) 75 MG EC tablet Take 1 tablet (75 mg total) by mouth 2 (two) times daily. 06/15/19   Cathie HoopsYu, Valborg Friar V, PA-C  ibuprofen (ADVIL,MOTRIN) 200 MG tablet Take 400-600  mg by mouth 2 (two) times daily as needed for moderate pain (pain). As needed for pain    [provider]  lisinopril-hydrochlorothiazide (PRINZIDE,ZESTORETIC) 10-12.5 MG per tablet Take 1 tablet by mouth daily.    [provider]  naproxen (NAPROSYN) 500 MG tablet Take 1 tablet (500 mg total) by mouth 2 (two) times daily. 06/12/18   Geoffery Lyonselo, [place], MD  traMADol (ULTRAM) 50 MG tablet Take 1 tablet (50 mg total) by mouth every 6 (six) hours as needed for severe pain. Patient not taking: Reported on 06/15/2019 02/02/15   Earley FavorSchulz, Gail, NP    Family History Family History  Problem Relation Age of Onset   Stroke Father    Hypertension Other     Social History Social History   Tobacco Use   Smoking status: Never Smoker   Smokeless tobacco: Never Used  Substance Use Topics   Alcohol use: Yes    Comment: rarely   Drug use: No     Allergies   Patient has no known allergies.   Review of Systems Review of Systems  Reason unable to perform ROS: See HPI as above.     Physical Exam Triage Vital Signs ED Triage Vitals  Enc Vitals Group     BP 06/15/19 1159 (!) 157/96     Pulse Rate 06/15/19 1159 75     Resp 06/15/19 1159 16  Temp 06/15/19 1159 98.1 F (36.7 C)     Temp Source 06/15/19 1159 Oral     SpO2 06/15/19 1159 95 %     Weight --      Height --      Head Circumference --      Peak Flow --      Pain Score 06/15/19 1204 5     Pain Loc --      Pain Edu? --      Excl. in [place]? --    No data found.  Updated Vital Signs BP (!) 157/96 (BP Location: Left Arm) Comment: Patient has not taken BP meds today   Pulse 75    Temp 98.1 F (36.7 C) (Oral)    Resp 16    SpO2 95%   Physical Exam Constitutional:      General: He is not in acute distress.    Appearance: He is well-developed. He is not diaphoretic.  HENT:     Head: Normocephalic and atraumatic.  Eyes:     Conjunctiva/sclera: Conjunctivae normal.     Pupils: Pupils are equal, round, and  reactive to light.  Cardiovascular:     Rate and Rhythm: Normal rate and regular rhythm.     Heart sounds: No murmur. No friction rub. No gallop.   Pulmonary:     Effort: Pulmonary effort is normal. No respiratory distress.     Breath sounds: Normal breath sounds. No stridor. No wheezing, rhonchi or rales.  Musculoskeletal:     Comments: No swelling, erythema, warmth.  No tenderness to palpation of the clavicle, thoracic back, neck.  Tenderness to palpation to the anterior shoulder, along bicep tendon insert.  Full range of motion of shoulder, neck, elbow.  Strength normal and equal bilaterally.  Sensation intact and equal bilaterally.  Radial pulse 2+, cap refill less than 2 seconds.  Neurological:     Mental Status: He is alert and oriented to person, place, and time.      UC Treatments / Results  Labs (all labs ordered are listed, but only abnormal results are displayed) Labs Reviewed - No data to display  EKG   Radiology No results found.  Procedures Procedures (including critical care time)  Medications Ordered in UC Medications - No data to display  Initial Impression / Assessment and Plan / UC Course  I have reviewed the triage vital signs and the nursing notes.  Pertinent labs & imaging results that were available during my care of the patient were reviewed by me and considered in my medical decision making (see chart for details).    History and exam consistent with bicep tendinitis.  Diclofenac as directed.  Ice compress, rest.  Return precautions given.  Patient expresses understanding and agrees to plan.  Final Clinical Impressions(s) / UC Diagnoses   Final diagnoses:  Biceps tendinitis of right upper extremity   ED Prescriptions    Medication Sig Dispense Auth. Provider   diclofenac (VOLTAREN) 75 MG EC tablet Take 1 tablet (75 mg total) by mouth 2 (two) times daily. 14 tablet Tobin Chad, [place] 06/15/19 1319

## 2019-06-15 NOTE — ED Triage Notes (Signed)
Pt here for right shoulder pain x months; pt sts injured in past but denies new injury

## 2019-06-15 NOTE — Discharge Instructions (Signed)
Start diflofenac as directed. You can take tylenol and use over the counter voltaren gel to supplement if needed. Ice compress to affected area. This may take a few weeks to completely resolve, but should be feeling better each week. Follow up with PCP if symptoms not improving.

## 2019-06-22 ENCOUNTER — Encounter: Payer: Self-pay | Admitting: Emergency Medicine

## 2019-06-22 ENCOUNTER — Other Ambulatory Visit: Payer: Self-pay

## 2019-06-22 ENCOUNTER — Ambulatory Visit
Admission: EM | Admit: 2019-06-22 | Discharge: 2019-06-22 | Disposition: A | Discharge status: Home / Self Care | Payer: Federal, State, Local not specified - PPO

## 2019-06-22 DIAGNOSIS — M7521 Bicipital tendinitis, right shoulder: Secondary | ICD-10-CM

## 2019-06-22 NOTE — ED Notes (Signed)
Patient able to ambulate independently  

## 2019-06-22 NOTE — ED Triage Notes (Signed)
Pt presents to [facility] for assessment of relieved for left shoulder pain, states he needs a note he can return to work tomorrow.

## 2019-06-22 NOTE — Discharge Instructions (Addendum)
May ice, rest, elevate area is causing most pain.  Can also use hot compresses/warm wash rags to relieve muscle tightness. May use OTC Tylenol, ibuprofen as needed for pain. Return if you develop worsening pain, chest pain, difficulty breathing.  When you run out of your diclofenac, you may switch to 20 mg Aleve (naproxen) twice daily with food: Important to not take ibuprofen, Motrin, BC powders with this medication.

## 2019-06-22 NOTE — ED Provider Notes (Signed)
[facility]    CSN: [id number] Arrival date & time: 06/22/19  1343      History   Chief Complaint Chief Complaint  Patient presents with  . Work Note    HPI Sean Clements is a 53 y.o. male with history of hypertension presenting for shoulder pain.  Patient was seen on 8/17 initially for this: Records reviewed by me.  History physical consistent with bicep tendinitis: Treated with diclofenac.  Patient states this has been adequately alleviating pain.  Patient has been doing mild exercises, though has not yet returned to work.  Patient states "I do not think I be up for it yet ".  Patient states that he feels better now "I think you should be able to go back tomorrow ".  Patient does a lot of heavy lifting at work, feels that he should be able to perform light duty.   Past Medical History:  Diagnosis Date  . Hypertension     There are no active problems to display for this patient.   Past Surgical History:  Procedure Laterality Date  . HERNIA REPAIR    . NASAL SINUS SURGERY         Home Medications    Prior to Admission medications   Medication Sig Start Date End Date Taking? Authorizing Provider  hydrochlorothiazide (HYDRODIURIL) 25 MG tablet Take 25 mg by mouth daily.   Yes [provider]  cetirizine-pseudoephedrine (ZYRTEC-D) 5-120 MG tablet Take 1 tablet by mouth 2 (two) times daily. 12/11/18   Wieters, Hallie C, PA-C  diclofenac (VOLTAREN) 75 MG EC tablet Take 1 tablet (75 mg total) by mouth 2 (two) times daily. 06/15/19   Tasia Catchings, Amy V, PA-C  ibuprofen (ADVIL,MOTRIN) 200 MG tablet Take 400-600 mg by mouth 2 (two) times daily as needed for moderate pain (pain). As needed for pain    [provider]  lisinopril-hydrochlorothiazide (PRINZIDE,ZESTORETIC) 10-12.5 MG per tablet Take 1 tablet by mouth daily.    [provider]  naproxen (NAPROSYN) 500 MG tablet Take 1 tablet (500 mg total) by mouth 2 (two) times daily. 06/12/18    Veryl Speak, MD    Family History Family History  Problem Relation Age of Onset  . Stroke Father   . Hypertension Other     Social History Social History   Tobacco Use  . Smoking status: Never Smoker  . Smokeless tobacco: Never Used  Substance Use Topics  . Alcohol use: Yes    Comment: rarely  . Drug use: No     Allergies   Patient has no known allergies.   Review of Systems Review of Systems  Constitutional: Negative for fatigue and fever.  Respiratory: Negative for cough and shortness of breath.   Cardiovascular: Negative for chest pain and palpitations.  Musculoskeletal: Negative for back pain, gait problem, joint swelling, neck pain and neck stiffness.  Neurological: Negative for weakness and numbness.     Physical Exam Triage Vital Signs ED Triage Vitals  Enc Vitals Group     BP 06/22/19 1356 137/86     Pulse Rate 06/22/19 1356 78     Resp 06/22/19 1356 18     Temp 06/22/19 1356 97.9 F (36.6 C)     Temp Source 06/22/19 1356 Oral     SpO2 06/22/19 1356 94 %     Weight --      Height --      Head Circumference --      Peak Flow --  Pain Score 06/22/19 1358 0     Pain Loc --      Pain Edu? --      Excl. in [place]? --    No data found.  Updated Vital Signs BP 137/86 (BP Location: Left Arm)   Pulse 78   Temp 97.9 F (36.6 C) (Oral)   Resp 18   SpO2 94%    Physical Exam Constitutional:      General: He is not in acute distress. HENT:     Head: Normocephalic and atraumatic.  Eyes:     General: No scleral icterus.    Pupils: Pupils are equal, round, and reactive to light.  Cardiovascular:     Rate and Rhythm: Normal rate.  Pulmonary:     Effort: Pulmonary effort is normal. No respiratory distress.     Breath sounds: No wheezing.  Musculoskeletal: Normal range of motion.        General: No swelling, tenderness or deformity.     Comments: Bicipital tendon insertion at anterior shoulder with mild tenderness  Skin:    General: Skin is  warm.     Capillary Refill: Capillary refill takes less than 2 seconds.     Coloration: Skin is not jaundiced.     Findings: No bruising.  Neurological:     General: No focal deficit present.     Mental Status: He is alert.     Sensory: No sensory deficit.     Motor: No weakness.     Deep Tendon Reflexes: Reflexes normal.      UC Treatments / Results  Labs (all labs ordered are listed, but only abnormal results are displayed) Labs Reviewed - No data to display  EKG   Radiology No results found.  Procedures Procedures (including critical care time)  Medications Ordered in UC Medications - No data to display  Initial Impression / Assessment and Plan / UC Course  I have reviewed the triage vital signs and the nursing notes.  Pertinent labs & imaging results that were available during my care of the patient were reviewed by me and considered in my medical decision making (see chart for details).     1.  Bicep tendinitis on right Resolving.  Patient reporting significant improvement, will continue diclofenac.  Strength and range of motion are intact today: Willing to do 5 more days of no heavy lifting.  Stressed that thereafter if patient is having significant discomfort while at work or cannot lift more than 25 pounds, he will need to be further evaluated: Contacts provided for sports medicine, occupational health.  Return precautions discussed, patient verbalized understanding and is agreeable to plan. Final Clinical Impressions(s) / UC Diagnoses   Final diagnoses:  Biceps tendonitis on right     Discharge Instructions     May ice, rest, elevate area is causing most pain.  Can also use hot compresses/warm wash rags to relieve muscle tightness. May use OTC Tylenol, ibuprofen as needed for pain. Return if you develop worsening pain, chest pain, difficulty breathing.  When you run out of your diclofenac, you may switch to 20 mg Aleve (naproxen) twice daily with food:  Important to not take ibuprofen, Motrin, BC powders with this medication.    ED Prescriptions    None     Controlled Substance Prescriptions [place] Controlled Substance Registry consulted? Not Applicable   Shea EvansHall-Potvin, [place], [place] 06/22/19 1509

## 2019-08-13 DIAGNOSIS — K4090 Unilateral inguinal hernia, without obstruction or gangrene, not specified as recurrent: Secondary | ICD-10-CM | POA: Diagnosis not present

## 2019-09-18 DIAGNOSIS — Z01818 Encounter for other preprocedural examination: Secondary | ICD-10-CM | POA: Diagnosis not present

## 2019-09-22 DIAGNOSIS — D176 Benign lipomatous neoplasm of spermatic cord: Secondary | ICD-10-CM | POA: Diagnosis not present

## 2019-09-22 DIAGNOSIS — K4090 Unilateral inguinal hernia, without obstruction or gangrene, not specified as recurrent: Secondary | ICD-10-CM | POA: Diagnosis not present

## 2020-07-06 ENCOUNTER — Other Ambulatory Visit: Payer: Self-pay

## 2020-07-06 ENCOUNTER — Ambulatory Visit
Admission: EM | Admit: 2020-07-06 | Discharge: 2020-07-06 | Disposition: A | Discharge status: Home / Self Care | Payer: Federal, State, Local not specified - PPO | Attending: Physician Assistant | Admitting: Physician Assistant

## 2020-07-06 DIAGNOSIS — Z1152 Encounter for screening for COVID-19: Secondary | ICD-10-CM

## 2020-07-06 DIAGNOSIS — Z20822 Contact with and (suspected) exposure to covid-19: Secondary | ICD-10-CM | POA: Diagnosis not present

## 2020-07-06 NOTE — ED Triage Notes (Signed)
Pt requesting covid testing. States his sx's has subsided. States had a inconclusive covid test with [facility] and needs a neg test to return to work.

## 2020-07-06 NOTE — Discharge Instructions (Signed)

## 2020-07-09 LAB — NOVEL CORONAVIRUS, NAA

## 2020-07-12 ENCOUNTER — Ambulatory Visit
Admission: EM | Admit: 2020-07-12 | Discharge: 2020-07-12 | Disposition: A | Discharge status: Home / Self Care | Payer: Federal, State, Local not specified - PPO | Attending: Physician Assistant | Admitting: Physician Assistant

## 2020-07-12 DIAGNOSIS — Z1152 Encounter for screening for COVID-19: Secondary | ICD-10-CM

## 2020-07-12 NOTE — ED Triage Notes (Signed)
Pt here for covid reswab

## 2020-07-14 LAB — NOVEL CORONAVIRUS, NAA: SARS-CoV-2, NAA: NOT DETECTED

## 2020-07-14 LAB — SARS-COV-2, NAA 2 DAY TAT

## 2020-07-18 DIAGNOSIS — H35411 Lattice degeneration of retina, right eye: Secondary | ICD-10-CM | POA: Diagnosis not present

## 2020-07-18 DIAGNOSIS — H40013 Open angle with borderline findings, low risk, bilateral: Secondary | ICD-10-CM | POA: Diagnosis not present

## 2020-09-15 DIAGNOSIS — I10 Essential (primary) hypertension: Secondary | ICD-10-CM | POA: Diagnosis not present

## 2020-10-26 DIAGNOSIS — N400 Enlarged prostate without lower urinary tract symptoms: Secondary | ICD-10-CM | POA: Diagnosis not present

## 2020-12-05 DIAGNOSIS — H40013 Open angle with borderline findings, low risk, bilateral: Secondary | ICD-10-CM | POA: Diagnosis not present

## 2021-03-13 DIAGNOSIS — M25511 Pain in right shoulder: Secondary | ICD-10-CM | POA: Diagnosis not present

## 2021-03-13 DIAGNOSIS — I10 Essential (primary) hypertension: Secondary | ICD-10-CM | POA: Diagnosis not present

## 2021-03-13 DIAGNOSIS — Z1211 Encounter for screening for malignant neoplasm of colon: Secondary | ICD-10-CM | POA: Diagnosis not present

## 2021-03-22 DIAGNOSIS — R739 Hyperglycemia, unspecified: Secondary | ICD-10-CM | POA: Diagnosis not present

## 2021-05-03 DIAGNOSIS — H40013 Open angle with borderline findings, low risk, bilateral: Secondary | ICD-10-CM | POA: Diagnosis not present

## 2021-05-30 DIAGNOSIS — F331 Major depressive disorder, recurrent, moderate: Secondary | ICD-10-CM | POA: Diagnosis not present

## 2021-06-26 DIAGNOSIS — H33311 Horseshoe tear of retina without detachment, right eye: Secondary | ICD-10-CM | POA: Diagnosis not present

## 2021-06-26 DIAGNOSIS — H43811 Vitreous degeneration, right eye: Secondary | ICD-10-CM | POA: Diagnosis not present

## 2021-06-26 DIAGNOSIS — H33321 Round hole, right eye: Secondary | ICD-10-CM | POA: Diagnosis not present

## 2021-06-26 DIAGNOSIS — H3322 Serous retinal detachment, left eye: Secondary | ICD-10-CM | POA: Diagnosis not present

## 2021-06-26 DIAGNOSIS — H33022 Retinal detachment with multiple breaks, left eye: Secondary | ICD-10-CM | POA: Diagnosis not present

## 2021-06-26 DIAGNOSIS — H4312 Vitreous hemorrhage, left eye: Secondary | ICD-10-CM | POA: Diagnosis not present

## 2021-06-29 DIAGNOSIS — H33022 Retinal detachment with multiple breaks, left eye: Secondary | ICD-10-CM | POA: Diagnosis not present

## 2021-06-29 DIAGNOSIS — H35342 Macular cyst, hole, or pseudohole, left eye: Secondary | ICD-10-CM | POA: Diagnosis not present

## 2021-06-30 DIAGNOSIS — T85398A Other mechanical complication of other ocular prosthetic devices, implants and grafts, initial encounter: Secondary | ICD-10-CM | POA: Diagnosis not present

## 2021-06-30 DIAGNOSIS — H31092 Other chorioretinal scars, left eye: Secondary | ICD-10-CM | POA: Diagnosis not present

## 2021-07-07 DIAGNOSIS — H31093 Other chorioretinal scars, bilateral: Secondary | ICD-10-CM | POA: Diagnosis not present

## 2021-07-07 DIAGNOSIS — H35342 Macular cyst, hole, or pseudohole, left eye: Secondary | ICD-10-CM | POA: Diagnosis not present

## 2021-07-11 DIAGNOSIS — F331 Major depressive disorder, recurrent, moderate: Secondary | ICD-10-CM | POA: Diagnosis not present

## 2021-07-28 DIAGNOSIS — H31093 Other chorioretinal scars, bilateral: Secondary | ICD-10-CM | POA: Diagnosis not present

## 2021-09-13 DIAGNOSIS — R7301 Impaired fasting glucose: Secondary | ICD-10-CM | POA: Diagnosis not present

## 2021-09-13 DIAGNOSIS — I10 Essential (primary) hypertension: Secondary | ICD-10-CM | POA: Diagnosis not present

## 2021-09-13 DIAGNOSIS — Z23 Encounter for immunization: Secondary | ICD-10-CM | POA: Diagnosis not present

## 2021-10-10 DIAGNOSIS — H2513 Age-related nuclear cataract, bilateral: Secondary | ICD-10-CM | POA: Diagnosis not present

## 2021-10-10 DIAGNOSIS — H31092 Other chorioretinal scars, left eye: Secondary | ICD-10-CM | POA: Diagnosis not present

## 2021-10-10 DIAGNOSIS — T85398A Other mechanical complication of other ocular prosthetic devices, implants and grafts, initial encounter: Secondary | ICD-10-CM | POA: Diagnosis not present

## 2021-10-17 DIAGNOSIS — T85398A Other mechanical complication of other ocular prosthetic devices, implants and grafts, initial encounter: Secondary | ICD-10-CM | POA: Diagnosis not present

## 2021-10-18 DIAGNOSIS — N400 Enlarged prostate without lower urinary tract symptoms: Secondary | ICD-10-CM | POA: Diagnosis not present

## 2021-10-23 ENCOUNTER — Ambulatory Visit
Admission: EM | Admit: 2021-10-23 | Discharge: 2021-10-23 | Disposition: A | Discharge status: Home / Self Care | Payer: Federal, State, Local not specified - PPO | Attending: Emergency Medicine | Admitting: Emergency Medicine

## 2021-10-23 DIAGNOSIS — J310 Chronic rhinitis: Secondary | ICD-10-CM | POA: Diagnosis not present

## 2021-10-23 DIAGNOSIS — R0981 Nasal congestion: Secondary | ICD-10-CM

## 2021-10-23 DIAGNOSIS — J329 Chronic sinusitis, unspecified: Secondary | ICD-10-CM | POA: Diagnosis not present

## 2021-10-23 DIAGNOSIS — J309 Allergic rhinitis, unspecified: Secondary | ICD-10-CM

## 2021-10-23 MED ORDER — PROMETHAZINE-DM 6.25-15 MG/5ML PO SYRP: 5.0000 mL | ORAL_SOLUTION | Freq: Four times a day (QID) | ORAL | 0 refills | Status: DC | PRN

## 2021-10-23 MED ORDER — METHYLPREDNISOLONE 4 MG PO TBPK: ORAL_TABLET | ORAL | 0 refills | Status: DC

## 2021-10-23 MED ORDER — METHYLPREDNISOLONE SODIUM SUCC 125 MG IJ SOLR
125.0000 mg | Freq: Once | INTRAMUSCULAR | Status: AC
  Administered 2021-10-23: 14:00:00 125 mg via INTRAMUSCULAR

## 2021-10-23 NOTE — ED Triage Notes (Signed)
Pt presents to the office for sinus pressure,congestion and headache.He reports symptoms started 2-3 days ago.

## 2021-10-23 NOTE — ED Provider Notes (Signed)
[facility]    CSN: [id number] Arrival date & time: 10/23/21  1227    HISTORY   Chief Complaint  Patient presents with   Facial Pain    Headache    HPI Sean Clements is a 55 y.o. male. Pt presents to the office for sinus pressure, congestion and headache.  He reports symptoms started 2-3 days ago.  Patient has a history of allergies, has been prescribed Zyrtec in the past, not currently taking.  The history is provided by the patient.  Past Medical History:  Diagnosis Date   Hypertension    There are no problems to display for this patient.  Past Surgical History:  Procedure Laterality Date   HERNIA REPAIR     NASAL SINUS SURGERY      Home Medications    Prior to Admission medications   Medication Sig Start Date End Date Taking? Authorizing Provider  hydrochlorothiazide (HYDRODIURIL) 25 MG tablet Take 25 mg by mouth daily.    [provider]  lisinopril-hydrochlorothiazide (PRINZIDE,ZESTORETIC) 10-12.5 MG per tablet Take 1 tablet by mouth daily.    [provider]   Family History Family History  Problem Relation Age of Onset   Stroke Father    Hypertension Other    Social History Social History   Tobacco Use   Smoking status: Never   Smokeless tobacco: Never  Substance Use Topics   Alcohol use: Yes    Comment: rarely   Drug use: No   Allergies   Patient has no known allergies.  Review of Systems Review of Systems Pertinent findings noted in history of present illness.   Physical Exam Triage Vital Signs ED Triage Vitals  Enc Vitals Group     BP 08/25/21 0827 (!) 147/82     Pulse Rate 08/25/21 0827 72     Resp 08/25/21 0827 18     Temp 08/25/21 0827 98.3 F (36.8 C)     Temp Source 08/25/21 0827 Oral     SpO2 08/25/21 0827 98 %     Weight --      Height --      Head Circumference --      Peak Flow --      Pain Score 08/25/21 0826 5     Pain Loc --      Pain Edu? --      Excl. in GC? --   No data  found.  Updated Vital Signs BP 133/87 (BP Location: Left Arm)    Pulse 97    Temp 98.7 F (37.1 C) (Oral)    Resp 18   Physical Exam Vitals and nursing note reviewed.  Constitutional:      General: He is not in acute distress.    Appearance: Normal appearance. He is not ill-appearing.  HENT:     Head: Normocephalic and atraumatic.     Salivary Glands: Right salivary gland is not diffusely enlarged or tender. Left salivary gland is not diffusely enlarged or tender.     Right Ear: Ear canal and external ear normal. No drainage. A middle ear effusion is present. There is no impacted cerumen. Tympanic membrane is bulging. Tympanic membrane is not injected or erythematous.     Left Ear: Ear canal and external ear normal. No drainage. A middle ear effusion is present. There is no impacted cerumen. Tympanic membrane is bulging. Tympanic membrane is not injected or erythematous.     Ears:     Comments: Bilateral EACs normal, both  TMs bulging with clear fluid    Nose: Rhinorrhea present. No nasal deformity, septal deviation, signs of injury, nasal tenderness, mucosal edema or congestion. Rhinorrhea is clear.     Right Nostril: Occlusion present. No foreign body, epistaxis or septal hematoma.     Left Nostril: Occlusion present. No foreign body, epistaxis or septal hematoma.     Right Turbinates: Enlarged, swollen and pale.     Left Turbinates: Enlarged, swollen and pale.     Right Sinus: No maxillary sinus tenderness or frontal sinus tenderness.     Left Sinus: No maxillary sinus tenderness or frontal sinus tenderness.     Mouth/Throat:     Lips: Pink. No lesions.     Mouth: Mucous membranes are moist. No oral lesions.     Pharynx: Oropharynx is clear. Uvula midline. No posterior oropharyngeal erythema or uvula swelling.     Tonsils: No tonsillar exudate. 0 on the right. 0 on the left.     Comments: Postnasal drip Eyes:     General: Lids are normal.        Right eye: No discharge.         Left eye: No discharge.     Extraocular Movements: Extraocular movements intact.     Conjunctiva/sclera: Conjunctivae normal.     Right eye: Right conjunctiva is not injected.     Left eye: Left conjunctiva is not injected.  Neck:     Trachea: Trachea and phonation normal.  Cardiovascular:     Rate and Rhythm: Normal rate and regular rhythm.     Pulses: Normal pulses.     Heart sounds: Normal heart sounds. No murmur heard.   No friction rub. No gallop.  Pulmonary:     Effort: Pulmonary effort is normal. No accessory muscle usage, prolonged expiration or respiratory distress.     Breath sounds: Normal breath sounds. No stridor, decreased air movement or transmitted upper airway sounds. No decreased breath sounds, wheezing, rhonchi or rales.  Chest:     Chest wall: No tenderness.  Musculoskeletal:        General: Normal range of motion.     Cervical back: Normal range of motion and neck supple. Normal range of motion.  Lymphadenopathy:     Cervical: No cervical adenopathy.  Skin:    General: Skin is warm and dry.     Findings: No erythema or rash.  Neurological:     General: No focal deficit present.     Mental Status: He is alert and oriented to person, place, and time.  Psychiatric:        Mood and Affect: Mood normal.        Behavior: Behavior normal.    Visual Acuity Right Eye Distance:   Left Eye Distance:   Bilateral Distance:    Right Eye Near:   Left Eye Near:    Bilateral Near:     UC Couse / Diagnostics / Procedures:    EKG  Radiology No results found.  Procedures Procedures (including critical care time)  UC Diagnoses / Final Clinical Impressions(s)   I have reviewed the triage vital signs and the nursing notes.  Pertinent labs & imaging results that were available during my care of the patient were reviewed by me and considered in my medical decision making (see chart for details).   Final diagnoses:  Rhinosinusitis  Allergic rhinitis, unspecified  seasonality, unspecified trigger  Nasal congestion   Patient with significant sinusitis, postnasal drip.  Patient provided with steroid  injection and oral course to quickly address the inflammation in his nasal passages and sinuses, pressure.  Patient provided with a cough medicine at night at wife's request.  Follow-up precautions advised.  ED Prescriptions     Medication Sig Dispense Auth. Provider   methylPREDNISolone (MEDROL DOSEPAK) 4 MG TBPK tablet Take 24 mg on day 1, 20 mg on day 2, 16 mg on day 3, 12 mg on day 4, 8 mg on day 5, 4 mg on day 6. 21 tablet Theadora Rama Scales, PA-C   promethazine-dextromethorphan (PROMETHAZINE-DM) 6.25-15 MG/5ML syrup Take 5 mLs by mouth 4 (four) times daily as needed for cough. 180 mL Theadora Rama Scales, PA-C      PDMP not reviewed this encounter.  Pending results:  Labs Reviewed - No data to display  Medications Ordered in UC: Medications  methylPREDNISolone sodium succinate (SOLU-MEDROL) 125 mg/2 mL injection 125 mg (has no administration in time range)    Disposition Upon Discharge:  Condition: stable for discharge home Home: take medications as prescribed; routine discharge instructions as discussed; follow up as advised.  Patient presented with an acute illness with associated systemic symptoms and significant discomfort requiring urgent management. In my opinion, this is a condition that a prudent lay person (someone who possesses an average knowledge of health and medicine) may potentially expect to result in complications if not addressed urgently such as respiratory distress, impairment of bodily function or dysfunction of bodily organs.   Routine symptom specific, illness specific and/or disease specific instructions were discussed with the patient and/or caregiver at length.   As such, the patient has been evaluated and assessed, work-up was performed and treatment was provided in alignment with urgent care protocols and  evidence based medicine.  Patient/parent/caregiver has been advised that the patient may require follow up for further testing and treatment if the symptoms continue in spite of treatment, as clinically indicated and appropriate.  If the patient was tested for COVID-19, Influenza and/or RSV, then the patient/parent/guardian was advised to isolate at home pending the results of his/her diagnostic coronavirus test and potentially longer if theyre positive. I have also advised pt that if his/her COVID-19 test returns positive, it's recommended to self-isolate for at least 10 days after symptoms first appeared AND until fever-free for 24 hours without fever reducer AND other symptoms have improved or resolved. Discussed self-isolation recommendations as well as instructions for household member/close contacts as per the [facility] and [place] [facility], and also gave patient the COVID packet with this information.  Patient/parent/caregiver has been advised to return to the [facility] or PCP in 3-5 days if no better; to PCP or the Emergency Department if new signs and symptoms develop, or if the current signs or symptoms continue to change or worsen for further workup, evaluation and treatment as clinically indicated and appropriate  The patient will follow up with their current PCP if and as advised. If the patient does not currently have a PCP we will assist them in obtaining one.   The patient may need specialty follow up if the symptoms continue, in spite of conservative treatment and management, for further workup, evaluation, consultation and treatment as clinically indicated and appropriate.  Patient/parent/caregiver verbalized understanding and agreement of plan as discussed.  All questions were addressed during visit.  Please see discharge instructions below for further details of plan.  Discharge Instructions:   Discharge Instructions      Methylprednisolone IM (Solu-Medrol):  To quickly address your significant  respiratory inflammation, you are  provided with an injection of methylprednisolone in the office today.  You should continue to feel the full benefit of the steroid for the next 4 to 6 hours.    Methylprednisolone (Medrol Dosepak): This is a steroid that will significantly calm your upper and lower airways, please take one row of tablets daily with your breakfast meal starting tomorrow morning until the prescription is complete.      Promethazine DM: Promethazine is both the nasal decongestant and an antinausea medication that makes most patients feel fairly sleepy.  The DM is dextromethorphan, a cough suppressant found many over-the-counter cough medications.  Please take 5 mL before bedtime to help you sleep better, minimize your cough.  I have provided you with a prescription for this medication.      Please feel free to continue using Alka-Seltzer cold.  I also think it would be a good idea for you to begin taking Zyrtec 10 mg once daily, this is available over-the-counter.  Please follow-up within the next 3 to 5 days either with your primary care provider or urgent care if your symptoms do not resolve.  If you do not have a primary care provider, we will assist you in finding one.  Thank you for visiting urgent care today, I hope you feel better soon.      This office note has been dictated using Teaching laboratory technician.  Unfortunately, and despite my best efforts, this method of dictation can sometimes lead to occasional typographical or grammatical errors.  I apologize in advance if this occurs.       Theadora Rama Scales, PA-C 10/23/21 1334

## 2021-10-23 NOTE — Discharge Instructions (Addendum)
Methylprednisolone IM (Solu-Medrol):  To quickly address your significant respiratory inflammation, you are provided with an injection of methylprednisolone in the office today.  You should continue to feel the full benefit of the steroid for the next 4 to 6 hours.    Methylprednisolone (Medrol Dosepak): This is a steroid that will significantly calm your upper and lower airways, please take one row of tablets daily with your breakfast meal starting tomorrow morning until the prescription is complete.      Promethazine DM: Promethazine is both the nasal decongestant and an antinausea medication that makes most patients feel fairly sleepy.  The DM is dextromethorphan, a cough suppressant found many over-the-counter cough medications.  Please take 5 mL before bedtime to help you sleep better, minimize your cough.  I have provided you with a prescription for this medication.      Please feel free to continue using Alka-Seltzer cold.  I also think it would be a good idea for you to begin taking Zyrtec 10 mg once daily, this is available over-the-counter.  Please follow-up within the next 3 to 5 days either with your primary care provider or urgent care if your symptoms do not resolve.  If you do not have a primary care provider, we will assist you in finding one.  Thank you for visiting urgent care today, I hope you feel better soon.

## 2021-11-03 DIAGNOSIS — Z03818 Encounter for observation for suspected exposure to other biological agents ruled out: Secondary | ICD-10-CM | POA: Diagnosis not present

## 2021-11-03 DIAGNOSIS — U071 COVID-19: Secondary | ICD-10-CM | POA: Diagnosis not present

## 2021-11-03 DIAGNOSIS — Z20828 Contact with and (suspected) exposure to other viral communicable diseases: Secondary | ICD-10-CM | POA: Diagnosis not present

## 2021-11-16 DIAGNOSIS — Z125 Encounter for screening for malignant neoplasm of prostate: Secondary | ICD-10-CM | POA: Diagnosis not present

## 2021-11-16 DIAGNOSIS — N400 Enlarged prostate without lower urinary tract symptoms: Secondary | ICD-10-CM | POA: Diagnosis not present

## 2022-03-13 DIAGNOSIS — H25043 Posterior subcapsular polar age-related cataract, bilateral: Secondary | ICD-10-CM | POA: Diagnosis not present

## 2022-03-13 DIAGNOSIS — H25013 Cortical age-related cataract, bilateral: Secondary | ICD-10-CM | POA: Diagnosis not present

## 2022-03-13 DIAGNOSIS — H18413 Arcus senilis, bilateral: Secondary | ICD-10-CM | POA: Diagnosis not present

## 2022-03-13 DIAGNOSIS — H2512 Age-related nuclear cataract, left eye: Secondary | ICD-10-CM | POA: Diagnosis not present

## 2022-03-13 DIAGNOSIS — H2513 Age-related nuclear cataract, bilateral: Secondary | ICD-10-CM | POA: Diagnosis not present

## 2022-04-27 DIAGNOSIS — H43811 Vitreous degeneration, right eye: Secondary | ICD-10-CM | POA: Diagnosis not present

## 2022-04-27 DIAGNOSIS — H31093 Other chorioretinal scars, bilateral: Secondary | ICD-10-CM | POA: Diagnosis not present

## 2022-04-27 DIAGNOSIS — H2513 Age-related nuclear cataract, bilateral: Secondary | ICD-10-CM | POA: Diagnosis not present

## 2022-04-27 DIAGNOSIS — T85398A Other mechanical complication of other ocular prosthetic devices, implants and grafts, initial encounter: Secondary | ICD-10-CM | POA: Diagnosis not present

## 2022-05-07 DIAGNOSIS — Z8669 Personal history of other diseases of the nervous system and sense organs: Secondary | ICD-10-CM | POA: Diagnosis not present

## 2022-05-07 DIAGNOSIS — Z48810 Encounter for surgical aftercare following surgery on the sense organs: Secondary | ICD-10-CM | POA: Diagnosis not present

## 2022-05-07 DIAGNOSIS — T85398A Other mechanical complication of other ocular prosthetic devices, implants and grafts, initial encounter: Secondary | ICD-10-CM | POA: Diagnosis not present

## 2022-05-07 DIAGNOSIS — H2512 Age-related nuclear cataract, left eye: Secondary | ICD-10-CM | POA: Diagnosis not present

## 2022-05-07 DIAGNOSIS — H43392 Other vitreous opacities, left eye: Secondary | ICD-10-CM | POA: Diagnosis not present

## 2022-05-15 DIAGNOSIS — H31092 Other chorioretinal scars, left eye: Secondary | ICD-10-CM | POA: Diagnosis not present

## 2022-06-05 DIAGNOSIS — H31092 Other chorioretinal scars, left eye: Secondary | ICD-10-CM | POA: Diagnosis not present

## 2022-06-28 ENCOUNTER — Other Ambulatory Visit: Payer: Self-pay

## 2022-06-28 ENCOUNTER — Ambulatory Visit
Admission: EM | Admit: 2022-06-28 | Discharge: 2022-06-28 | Disposition: A | Discharge status: Home / Self Care | Payer: Federal, State, Local not specified - PPO | Attending: Physician Assistant | Admitting: Physician Assistant

## 2022-06-28 ENCOUNTER — Encounter: Payer: Self-pay | Admitting: Emergency Medicine

## 2022-06-28 DIAGNOSIS — U071 COVID-19: Secondary | ICD-10-CM | POA: Insufficient documentation

## 2022-06-28 DIAGNOSIS — J069 Acute upper respiratory infection, unspecified: Secondary | ICD-10-CM

## 2022-06-28 DIAGNOSIS — R051 Acute cough: Secondary | ICD-10-CM | POA: Insufficient documentation

## 2022-06-28 DIAGNOSIS — R0981 Nasal congestion: Secondary | ICD-10-CM | POA: Insufficient documentation

## 2022-06-28 LAB — RESP PANEL BY RT-PCR (FLU A&B, COVID) ARPGX2
Influenza A by PCR: NEGATIVE
Influenza B by PCR: NEGATIVE
SARS Coronavirus 2 by RT PCR: POSITIVE — AB

## 2022-06-28 MED ORDER — FLUTICASONE PROPIONATE 50 MCG/ACT NA SUSP: 1.0000 | Freq: Every day | NASAL | 0 refills | Status: DC

## 2022-06-28 MED ORDER — BENZONATATE 100 MG PO CAPS: 100.0000 mg | ORAL_CAPSULE | Freq: Three times a day (TID) | ORAL | 0 refills | Status: DC

## 2022-06-28 NOTE — ED Provider Notes (Signed)
[facility]    CSN: [id number] Arrival date & time: 06/28/22  0803      History   Chief Complaint Chief Complaint  Patient presents with   URI    HPI Sean Clements is a 56 y.o. male.   Patient presents today with a 3 to 4-day history of URI symptoms.  Reports generalized weakness, scratchy throat, headache, cough, diarrhea, body aches.  Denies any chest pain, shortness of breath, nausea, vomiting.  Denies any known sick contacts.  He has not had COVID in the past.  He has had COVID-vaccine.  He does have a history of hypertension but denies additional risk factors for severe disease including diabetes, immunosuppression, malignancy, chronic liver/kidney disease.  Denies any recent antibiotic or steroid use.  He has been using over-the-counter cold and flu medications with temporary improvement of symptoms.  He is having difficulty with daily duties as result of symptoms.    Past Medical History:  Diagnosis Date   Hypertension     There are no problems to display for this patient.   Past Surgical History:  Procedure Laterality Date   HERNIA REPAIR     NASAL SINUS SURGERY         Home Medications    Prior to Admission medications   Medication Sig Start Date End Date Taking? Authorizing Provider  benzonatate (TESSALON) 100 MG capsule Take 1 capsule (100 mg total) by mouth every 8 (eight) hours. 06/28/22  Yes Tyric Rodeheaver K, PA-C  fluticasone (FLONASE) 50 MCG/ACT nasal spray Place 1 spray into both nostrils daily. 06/28/22  Yes Tobyn Osgood K, PA-C  hydrochlorothiazide (HYDRODIURIL) 25 MG tablet Take 25 mg by mouth daily.    [provider]    Family History Family History  Problem Relation Age of Onset   Stroke Father    Hypertension Other     Social History Social History   Tobacco Use   Smoking status: Never   Smokeless tobacco: Never  Vaping Use   Vaping Use: Never used  Substance Use Topics   Alcohol use: Yes    Comment:  rarely   Drug use: No     Allergies   Patient has no known allergies.   Review of Systems Review of Systems  Constitutional:  Positive for activity change, fatigue and fever. Negative for appetite change.  HENT:  Positive for congestion and sore throat. Negative for sinus pressure and sneezing.   Respiratory:  Positive for cough. Negative for shortness of breath.   Cardiovascular:  Negative for chest pain.  Gastrointestinal:  Positive for diarrhea. Negative for abdominal pain, nausea and vomiting.  Musculoskeletal:  Positive for arthralgias and myalgias.  Neurological:  Positive for headaches. Negative for dizziness and light-headedness.     Physical Exam Triage Vital Signs ED Triage Vitals  Enc Vitals Group     BP 06/28/22 0818 132/83     Pulse Rate 06/28/22 0818 73     Resp 06/28/22 0818 18     Temp 06/28/22 0818 98.5 F (36.9 C)     Temp Source 06/28/22 0818 Oral     SpO2 06/28/22 0818 95 %     Weight --      Height --      Head Circumference --      Peak Flow --      Pain Score 06/28/22 0816 4     Pain Loc --      Pain Edu? --      Excl.  in GC? --    No data found.  Updated Vital Signs BP 132/83 (BP Location: Left Arm) Comment (BP Location): large cuff  Pulse 73   Temp 98.5 F (36.9 C) (Oral)   Resp 18   SpO2 95%   Visual Acuity Right Eye Distance:   Left Eye Distance:   Bilateral Distance:    Right Eye Near:   Left Eye Near:    Bilateral Near:     Physical Exam Vitals reviewed.  Constitutional:      General: He is awake.     Appearance: Normal appearance. He is well-developed. He is not ill-appearing.     Comments: Very pleasant male presented age in no acute distress sitting comfortably in exam room  HENT:     Head: Normocephalic and atraumatic.     Right Ear: Tympanic membrane, ear canal and external ear normal. Tympanic membrane is not erythematous or bulging.     Left Ear: Tympanic membrane, ear canal and external ear normal. Tympanic  membrane is not erythematous or bulging.     Nose: Nose normal.     Mouth/Throat:     Pharynx: Uvula midline. Posterior oropharyngeal erythema present. No oropharyngeal exudate or uvula swelling.  Cardiovascular:     Rate and Rhythm: Normal rate and regular rhythm.     Heart sounds: Normal heart sounds, S1 normal and S2 normal. No murmur heard. Pulmonary:     Effort: Pulmonary effort is normal. No accessory muscle usage or respiratory distress.     Breath sounds: Normal breath sounds. No stridor. No wheezing, rhonchi or rales.     Comments: Clear to auscultation bilaterally Abdominal:     General: Bowel sounds are normal.     Palpations: Abdomen is soft.     Tenderness: There is no abdominal tenderness.  Neurological:     Mental Status: He is alert.  Psychiatric:        Behavior: Behavior is cooperative.      UC Treatments / Results  Labs (all labs ordered are listed, but only abnormal results are displayed) Labs Reviewed  RESP PANEL BY RT-PCR (FLU A&B, COVID) [id number]  BASIC METABOLIC PANEL    EKG   Radiology No results found.  Procedures Procedures (including critical care time)  Medications Ordered in UC Medications - No data to display  Initial Impression / Assessment and Plan / UC Course  I have reviewed the triage vital signs and the nursing notes.  Pertinent labs & imaging results that were available during my care of the patient were reviewed by me and considered in my medical decision making (see chart for details).     Suspect viral etiology given short duration of symptoms.  No evidence of acute infection on physical exam that would warrant initiation of antibiotics.  Patient is well-appearing, afebrile, nontoxic, nontachycardic.  Will test for flu and COVID.  Patient is outside the window of effectiveness for Tamiflu.  If he is positive for COVID he would benefit from Paxlovid given he is over the age of 43 with a history of hypertension.  We do not have a  recent kidney function on file so will obtain BMP for GFR in case he requires Paxlovid.  We will treat symptomatically with Tessalon and Flonase.  Recommended over-the-counter medications including Mucinex, Tylenol.  He is to rest and drink plenty of fluid.  Discussed that if his symptoms or not improving by next week he should return for reevaluation.  If he has any worsening symptoms  including chest pain, shortness of breath, fever not respond to medication, weakness, nausea/vomiting interfere with oral intake he needs to be seen immediately.  Strict return precautions given.  Work excuse note with current CDC return to work guidelines based on COVID test result provided during visit.  Final Clinical Impressions(s) / UC Diagnoses   Final diagnoses:  Upper respiratory tract infection, unspecified type  Nasal congestion  Acute cough     Discharge Instructions      We will contact you if you are positive for COVID or flu.  If you are positive for COVID you may benefit from Paxlovid.  In the meantime, use Tessalon for cough and Flonase for congestion.  Use Mucinex and Tylenol over-the-counter.  Make sure you are resting and drinking plenty of fluid.  If you have any worsening symptoms including nausea, vomiting, chest pain, shortness of breath, weakness you need to go to the emergency room.  If symptoms are not worsening but also not improving by next week return here or see your PCP.     ED Prescriptions     Medication Sig Dispense Auth. Provider   benzonatate (TESSALON) 100 MG capsule Take 1 capsule (100 mg total) by mouth every 8 (eight) hours. 21 capsule Fiora Weill K, PA-C   fluticasone (FLONASE) 50 MCG/ACT nasal spray Place 1 spray into both nostrils daily. 16 g Neave Lenger K, PA-C      PDMP not reviewed this encounter.   Jeani Hawking, PA-C 06/28/22 0076

## 2022-06-28 NOTE — ED Triage Notes (Signed)
Onset of symptoms was Saturday.  Reports weakness, scratchy throat, headache, cough, "maybe slight fever and body aches"

## 2022-06-28 NOTE — Discharge Instructions (Signed)
We will contact you if you are positive for COVID or flu.  If you are positive for COVID you may benefit from Paxlovid.  In the meantime, use Tessalon for cough and Flonase for congestion.  Use Mucinex and Tylenol over-the-counter.  Make sure you are resting and drinking plenty of fluid.  If you have any worsening symptoms including nausea, vomiting, chest pain, shortness of breath, weakness you need to go to the emergency room.  If symptoms are not worsening but also not improving by next week return here or see your PCP.

## 2022-06-29 ENCOUNTER — Telehealth (HOSPITAL_COMMUNITY): Payer: Self-pay | Admitting: Emergency Medicine

## 2022-06-29 LAB — BASIC METABOLIC PANEL
BUN/Creatinine Ratio: 14 (ref 9–20)
BUN: 12 mg/dL (ref 6–24)
CO2: 23 mmol/L (ref 20–29)
Calcium: 8.6 mg/dL — ABNORMAL LOW (ref 8.7–10.2)
Chloride: 106 mmol/L (ref 96–106)
Creatinine, Ser: 0.87 mg/dL (ref 0.76–1.27)
Glucose: 92 mg/dL (ref 70–99)
Potassium: 4 mmol/L (ref 3.5–5.2)
Sodium: 145 mmol/L — ABNORMAL HIGH (ref 134–144)
eGFR: 101 mL/min/{1.73_m2} (ref >59)

## 2022-06-29 NOTE — Telephone Encounter (Signed)
Opened in error, patient decided against medication 

## 2022-08-13 DIAGNOSIS — H40013 Open angle with borderline findings, low risk, bilateral: Secondary | ICD-10-CM | POA: Diagnosis not present

## 2022-08-14 DIAGNOSIS — Z23 Encounter for immunization: Secondary | ICD-10-CM | POA: Diagnosis not present

## 2022-08-14 DIAGNOSIS — I10 Essential (primary) hypertension: Secondary | ICD-10-CM | POA: Diagnosis not present

## 2022-12-11 DIAGNOSIS — H59812 Chorioretinal scars after surgery for detachment, left eye: Secondary | ICD-10-CM | POA: Diagnosis not present

## 2022-12-11 DIAGNOSIS — H43811 Vitreous degeneration, right eye: Secondary | ICD-10-CM | POA: Diagnosis not present

## 2023-02-18 DIAGNOSIS — H40013 Open angle with borderline findings, low risk, bilateral: Secondary | ICD-10-CM | POA: Diagnosis not present

## 2023-04-25 ENCOUNTER — Ambulatory Visit
Admission: EM | Admit: 2023-04-25 | Discharge: 2023-04-25 | Disposition: A | Discharge status: Home / Self Care | Payer: Federal, State, Local not specified - PPO | Attending: Urgent Care | Admitting: Urgent Care

## 2023-04-25 DIAGNOSIS — J069 Acute upper respiratory infection, unspecified: Secondary | ICD-10-CM | POA: Diagnosis not present

## 2023-04-25 DIAGNOSIS — J988 Other specified respiratory disorders: Secondary | ICD-10-CM

## 2023-04-25 DIAGNOSIS — I10 Essential (primary) hypertension: Secondary | ICD-10-CM | POA: Insufficient documentation

## 2023-04-25 DIAGNOSIS — Z1152 Encounter for screening for COVID-19: Secondary | ICD-10-CM | POA: Insufficient documentation

## 2023-04-25 DIAGNOSIS — R509 Fever, unspecified: Secondary | ICD-10-CM | POA: Insufficient documentation

## 2023-04-25 DIAGNOSIS — B9789 Other viral agents as the cause of diseases classified elsewhere: Secondary | ICD-10-CM | POA: Diagnosis not present

## 2023-04-25 MED ORDER — CETIRIZINE HCL 10 MG PO TABS: 10.0000 mg | ORAL_TABLET | Freq: Every day | ORAL | 0 refills | Status: AC

## 2023-04-25 MED ORDER — PROMETHAZINE-DM 6.25-15 MG/5ML PO SYRP: 5.0000 mL | ORAL_SOLUTION | Freq: Three times a day (TID) | ORAL | 0 refills | Status: AC | PRN

## 2023-04-25 MED ORDER — PSEUDOEPHEDRINE HCL 30 MG PO TABS: 30.0000 mg | ORAL_TABLET | Freq: Three times a day (TID) | ORAL | 0 refills | Status: AC | PRN

## 2023-04-25 NOTE — ED Triage Notes (Signed)
Pt reports cough, nasal congestion, drainage in throat x 2 days.

## 2023-04-25 NOTE — ED Provider Notes (Signed)
[facility]  Note:  This document was prepared using Conservation officer, historic buildings and may include unintentional dictation errors.  MRN: [id number] DOB: [birth year]  Subjective:   Sean Clements is a 57 y.o. male presenting for 2-day history of acute onset sinus congestion, sinus drainage, throat pain, coughing, malaise and fatigue, subjective fever.  No chest pain, shortness of breath or wheezing, body pains.  No history of asthma.  No smoking of any kind including cigarettes, cigars, vaping, marijuana use.  No current facility-administered medications for this encounter.  Current Outpatient Medications:    hydrochlorothiazide (HYDRODIURIL) 25 MG tablet, Take 25 mg by mouth daily., Disp: , Rfl:    No Known Allergies  Past Medical History:  Diagnosis Date   Hypertension      Past Surgical History:  Procedure Laterality Date   HERNIA REPAIR     NASAL SINUS SURGERY      Family History  Problem Relation Age of Onset   Stroke Father    Hypertension Other     Social History   Tobacco Use   Smoking status: Never   Smokeless tobacco: Never  Vaping Use   Vaping Use: Never used  Substance Use Topics   Alcohol use: Yes    Comment: rarely   Drug use: Never    ROS   Objective:   Vitals: BP (!) 147/82 (BP Location: Right Arm)   Pulse 88   Temp 98 F (36.7 C) (Oral)   Resp 18   SpO2 94%   Physical Exam Constitutional:      General: He is not in acute distress.    Appearance: Normal appearance. He is well-developed and normal weight. He is not ill-appearing, toxic-appearing or diaphoretic.  HENT:     Head: Normocephalic and atraumatic.     Right Ear: Tympanic membrane, ear canal and external ear normal. No drainage, swelling or tenderness. No middle ear effusion. There is no impacted cerumen. Tympanic membrane is not erythematous or bulging.     Left Ear: Tympanic membrane, ear canal and external ear normal. No drainage, swelling  or tenderness.  No middle ear effusion. There is no impacted cerumen. Tympanic membrane is not erythematous or bulging.     Nose: Nose normal. No congestion or rhinorrhea.     Mouth/Throat:     Mouth: Mucous membranes are moist.     Pharynx: No oropharyngeal exudate or posterior oropharyngeal erythema.  Eyes:     General: No scleral icterus.       Right eye: No discharge.        Left eye: No discharge.     Extraocular Movements: Extraocular movements intact.     Conjunctiva/sclera: Conjunctivae normal.  Cardiovascular:     Rate and Rhythm: Normal rate and regular rhythm.     Heart sounds: Normal heart sounds. No murmur heard.    No friction rub. No gallop.  Pulmonary:     Effort: Pulmonary effort is normal. No respiratory distress.     Breath sounds: Normal breath sounds. No stridor. No wheezing, rhonchi or rales.  Musculoskeletal:     Cervical back: Normal range of motion and neck supple. No rigidity. No muscular tenderness.  Neurological:     General: No focal deficit present.     Mental Status: He is alert and oriented to person, place, and time.  Psychiatric:        Mood and Affect: Mood normal.        Behavior: Behavior normal.  Thought Content: Thought content normal.    Assessment and Plan :   PDMP not reviewed this encounter.  1. Viral respiratory infection   2. Essential hypertension    Deferred imaging given clear cardiopulmonary exam, hemodynamically stable vital signs. Will manage for viral illness such as viral URI, viral syndrome, viral rhinitis, COVID-19. Recommended supportive care. Offered scripts for symptomatic relief. Testing is pending. Counseled patient on potential for adverse effects with medications prescribed/recommended today, ER and return-to-clinic precautions discussed, patient verbalized understanding.     Wallis Bamberg, [place] 04/25/23 1944

## 2023-04-25 NOTE — Discharge Instructions (Signed)
We will notify you of your test results as they arrive and may take between about 24 hours.  I encourage you to sign up for MyChart if you have not already done so as this can be the easiest way for us to communicate results to you online or through a phone app.  Generally, we only contact you if it is a positive test result.  In the meantime, if you develop worsening symptoms including fever, chest pain, shortness of breath despite our current treatment plan then please report to the emergency room as this may be a sign of worsening status from possible viral infection.  Otherwise, we will manage this as a viral syndrome. For sore throat or cough try using a honey-based tea. Use 3 teaspoons of honey with juice squeezed from half lemon. Place shaved pieces of ginger into 1/2-1 cup of water and warm over stove top. Then mix the ingredients and repeat every 4 hours as needed. Please take Tylenol 500mg-650mg every 6 hours for aches and pains, fevers. Hydrate very well with at least 2 liters of water. Eat light meals such as soups to replenish electrolytes and soft fruits, veggies. Start an antihistamine like Zyrtec (10mg daily) for postnasal drainage, sinus congestion.  You can take this together with pseudoephedrine (Sudafed) at a dose of 30 mg 2-3 times a day as needed for the same kind of congestion.  Use the cough medications as needed.  

## 2023-04-26 LAB — SARS CORONAVIRUS 2 (TAT 6-24 HRS): SARS Coronavirus 2: NEGATIVE

## 2023-07-09 DIAGNOSIS — H59812 Chorioretinal scars after surgery for detachment, left eye: Secondary | ICD-10-CM | POA: Diagnosis not present

## 2023-07-09 DIAGNOSIS — H2511 Age-related nuclear cataract, right eye: Secondary | ICD-10-CM | POA: Diagnosis not present

## 2023-07-09 DIAGNOSIS — Z8669 Personal history of other diseases of the nervous system and sense organs: Secondary | ICD-10-CM | POA: Diagnosis not present

## 2023-07-09 DIAGNOSIS — H43811 Vitreous degeneration, right eye: Secondary | ICD-10-CM | POA: Diagnosis not present

## 2023-12-05 DIAGNOSIS — M545 Low back pain, unspecified: Secondary | ICD-10-CM | POA: Diagnosis not present

## 2023-12-24 DIAGNOSIS — J320 Chronic maxillary sinusitis: Secondary | ICD-10-CM | POA: Diagnosis not present

## 2023-12-24 DIAGNOSIS — R7309 Other abnormal glucose: Secondary | ICD-10-CM | POA: Diagnosis not present

## 2023-12-24 DIAGNOSIS — H6692 Otitis media, unspecified, left ear: Secondary | ICD-10-CM | POA: Diagnosis not present

## 2023-12-24 DIAGNOSIS — I10 Essential (primary) hypertension: Secondary | ICD-10-CM | POA: Diagnosis not present

## 2024-01-20 DIAGNOSIS — H40013 Open angle with borderline findings, low risk, bilateral: Secondary | ICD-10-CM | POA: Diagnosis not present

## 2024-01-20 DIAGNOSIS — H40011 Open angle with borderline findings, low risk, right eye: Secondary | ICD-10-CM | POA: Diagnosis not present

## 2024-08-06 DIAGNOSIS — Z8669 Personal history of other diseases of the nervous system and sense organs: Secondary | ICD-10-CM | POA: Diagnosis not present

## 2024-08-06 DIAGNOSIS — H31092 Other chorioretinal scars, left eye: Secondary | ICD-10-CM | POA: Diagnosis not present

## 2024-08-06 DIAGNOSIS — H26492 Other secondary cataract, left eye: Secondary | ICD-10-CM | POA: Diagnosis not present

## 2024-08-06 DIAGNOSIS — H43811 Vitreous degeneration, right eye: Secondary | ICD-10-CM | POA: Diagnosis not present

## 2024-08-14 ENCOUNTER — Ambulatory Visit (INDEPENDENT_AMBULATORY_CARE_PROVIDER_SITE_OTHER)

## 2024-08-14 ENCOUNTER — Ambulatory Visit: Payer: Self-pay | Admitting: Nurse Practitioner

## 2024-08-14 ENCOUNTER — Ambulatory Visit
Admission: EM | Admit: 2024-08-14 | Discharge: 2024-08-14 | Disposition: A | Discharge status: Home / Self Care | Attending: Family Medicine | Admitting: Family Medicine

## 2024-08-14 DIAGNOSIS — S93401A Sprain of unspecified ligament of right ankle, initial encounter: Secondary | ICD-10-CM

## 2024-08-14 DIAGNOSIS — M25571 Pain in right ankle and joints of right foot: Secondary | ICD-10-CM | POA: Diagnosis not present

## 2024-08-14 NOTE — ED Triage Notes (Signed)
 Pt present with c/o rt side ankle pain since Tuesday. Pt states he was walking outside and twisted his ankle. Pt states he has pain when walking on it, reports swelling last night. Pt took ibuprofen last night.

## 2024-08-14 NOTE — ED Provider Notes (Signed)
 [facility]    CSN: [id number] Arrival date & time: 08/14/24  9177      History   Chief Complaint Chief Complaint  Patient presents with   Ankle Pain    HPI Sean Clements is a 58 y.o. male presents for ankle pain.  Patient reports Tuesday he rolled his right ankle.  States he had immediate swelling with inability to bear weight.  States this was improving until yesterday he thinks he tweaked it by stepping wrong and had some more swelling.  No bruising or numbness or tingling.  He has been taking ibuprofen for his symptoms.  No other injuries or concerns at this time   Ankle Pain   Past Medical History:  Diagnosis Date   Hypertension     There are no active problems to display for this patient.   Past Surgical History:  Procedure Laterality Date   HERNIA REPAIR     NASAL SINUS SURGERY         Home Medications    Prior to Admission medications   Medication Sig Start Date End Date Taking? Authorizing Provider  cetirizine  (ZYRTEC  ALLERGY) 10 MG tablet Take 1 tablet (10 mg total) by mouth daily. 04/25/23   Christopher Savannah, PA-C  hydrochlorothiazide (HYDRODIURIL) 25 MG tablet Take 25 mg by mouth daily.    [provider]  promethazine -dextromethorphan (PROMETHAZINE -DM) 6.25-15 MG/5ML syrup Take 5 mLs by mouth 3 (three) times daily as needed for cough. 04/25/23   Christopher Savannah, PA-C  pseudoephedrine  (SUDAFED) 30 MG tablet Take 1 tablet (30 mg total) by mouth every 8 (eight) hours as needed for congestion. 04/25/23   Christopher Savannah, PA-C    Family History Family History  Problem Relation Age of Onset   Stroke Father    Hypertension Other     Social History Social History   Tobacco Use   Smoking status: Never   Smokeless tobacco: Never  Vaping Use   Vaping status: Never Used  Substance Use Topics   Alcohol use: Yes    Comment: rarely   Drug use: Never     Allergies   Patient has no known allergies.   Review of Systems Review of  Systems  Musculoskeletal:        Right ankle pain     Physical Exam Triage Vital Signs ED Triage Vitals  Encounter Vitals Group     BP 08/14/24 0837 (!) 147/87     Girls Systolic BP Percentile --      Girls Diastolic BP Percentile --      Boys Systolic BP Percentile --      Boys Diastolic BP Percentile --      Pulse Rate 08/14/24 0835 68     Resp 08/14/24 0835 16     Temp 08/14/24 0835 98.3 F (36.8 C)     Temp src --      SpO2 08/14/24 0835 93 %     Weight --      Height --      Head Circumference --      Peak Flow --      Pain Score 08/14/24 0835 6     Pain Loc --      Pain Education --      Exclude from Growth Chart --    No data found.  Updated Vital Signs BP (!) 147/87   Pulse 68   Temp 98.3 F (36.8 C)   Resp 16   SpO2 93%   Visual  Acuity Right Eye Distance:   Left Eye Distance:   Bilateral Distance:    Right Eye Near:   Left Eye Near:    Bilateral Near:     Physical Exam Vitals and nursing note reviewed.  Constitutional:      Appearance: Normal appearance.  HENT:     Head: Normocephalic and atraumatic.  Eyes:     Pupils: Pupils are equal, round, and reactive to light.  Cardiovascular:     Rate and Rhythm: Normal rate.  Pulmonary:     Effort: Pulmonary effort is normal.  Musculoskeletal:     Right ankle: Swelling present. No deformity, ecchymosis or lacerations. Tenderness present over the lateral malleolus. No base of 5th metatarsal or proximal fibula tenderness. Normal range of motion. Normal pulse.     Comments: Mild swelling of the lateral malleolus with tenderness to palpation to site.  No deformity or ecchymosis.  Pain with dorsi extension of the foot.  DP +2.  Skin:    General: Skin is warm and dry.  Neurological:     General: No focal deficit present.     Mental Status: He is alert and oriented to person, place, and time.  Psychiatric:        Mood and Affect: Mood normal.        Behavior: Behavior normal.      UC Treatments /  Results  Labs (all labs ordered are listed, but only abnormal results are displayed) Labs Reviewed - No data to display  EKG   Radiology DG Ankle Complete Right Result Date: 08/14/2024 CLINICAL DATA:  Right ankle pain following a twisting injury 3 days ago. EXAM: RIGHT ANKLE - COMPLETE 3+ VIEW COMPARISON:  None Available. FINDINGS: Mild talotibial degenerative changes. Small posterior loose body. No fracture, dislocation or effusion. Moderate inferior and minimal posterior calcaneal enthesophyte formation. IMPRESSION: 1. No fracture. 2. Mild talotibial degenerative changes. 3. Small posterior loose body. Electronically Signed   By: Elspeth Bathe M.D.   On: 08/14/2024 08:52    Procedures Procedures (including critical care time)  Medications Ordered in UC Medications - No data to display  Initial Impression / Assessment and Plan / UC Course  I have reviewed the triage vital signs and the nursing notes.  Pertinent labs & imaging results that were available during my care of the patient were reviewed by me and considered in my medical decision making (see chart for details).     Reviewed exam and symptoms with patient.  No red flags.  Discussed ankle sprain and RICE therapy.  Ace wrap applied in clinic.  Continue OTC analgesics as needed.  PCP follow-up 1 week if symptoms do not improve.  ER precautions reviewed Final Clinical Impressions(s) / UC Diagnoses   Final diagnoses:  Sprain of right ankle, unspecified ligament, initial encounter     Discharge Instructions      Use the Ace wrap to your ankle to help support the area and also help with swelling.  Elevate and ice as needed.  You may take Tylenol or ibuprofen over-the-counter as needed for pain.  Please follow-up with your PCP in 1 week if your symptoms do not improve.  Please go to the ER for any worsening symptoms.  I hope you feel better soon!    ED Prescriptions   None    PDMP not reviewed this encounter.   Loreda Myla SAUNDERS, NP 08/14/24 [id number]

## 2024-08-14 NOTE — Discharge Instructions (Addendum)
 Use the Ace wrap to your ankle to help support the area and also help with swelling.  Elevate and ice as needed.  You may take Tylenol or ibuprofen over-the-counter as needed for pain.  Please follow-up with your PCP in 1 week if your symptoms do not improve.  Please go to the ER for any worsening symptoms.  I hope you feel better soon!
